# Patient Record
Sex: Female | Born: 1980 | Race: White | Hispanic: Yes | Marital: Married | Smoking: Never smoker
Health system: Southern US, Community
[De-identification: ages and names within clinical notes are randomized; demographics above are authoritative.]

## PROBLEM LIST (undated history)

## (undated) DIAGNOSIS — K219 Gastro-esophageal reflux disease without esophagitis: Secondary | ICD-10-CM

## (undated) DIAGNOSIS — K8050 Calculus of bile duct without cholangitis or cholecystitis without obstruction: Secondary | ICD-10-CM

## (undated) DIAGNOSIS — N912 Amenorrhea, unspecified: Secondary | ICD-10-CM

## (undated) DIAGNOSIS — K8020 Calculus of gallbladder without cholecystitis without obstruction: Secondary | ICD-10-CM

## (undated) DIAGNOSIS — D649 Anemia, unspecified: Secondary | ICD-10-CM

## (undated) HISTORY — DX: Calculus of bile duct without cholangitis or cholecystitis without obstruction: K80.50

## (undated) HISTORY — DX: Amenorrhea, unspecified: N91.2

## (undated) HISTORY — PX: OVARIAN CYST REMOVAL: SHX89

## (undated) HISTORY — DX: Calculus of gallbladder without cholecystitis without obstruction: K80.20

---

## 2002-12-18 ENCOUNTER — Inpatient Hospital Stay (HOSPITAL_COMMUNITY): Admission: AD | Admit: 2002-12-18 | Discharge: 2002-12-18 | Payer: Self-pay | Admitting: *Deleted

## 2002-12-19 ENCOUNTER — Inpatient Hospital Stay (HOSPITAL_COMMUNITY): Admission: AD | Admit: 2002-12-19 | Discharge: 2002-12-21 | Payer: Self-pay | Admitting: *Deleted

## 2005-09-25 ENCOUNTER — Ambulatory Visit: Payer: Self-pay | Admitting: Obstetrics and Gynecology

## 2005-10-01 ENCOUNTER — Ambulatory Visit (HOSPITAL_COMMUNITY): Admission: RE | Admit: 2005-10-01 | Discharge: 2005-10-01 | Payer: Self-pay | Admitting: *Deleted

## 2005-10-16 ENCOUNTER — Ambulatory Visit: Payer: Self-pay | Admitting: Family Medicine

## 2005-11-06 ENCOUNTER — Ambulatory Visit: Payer: Self-pay | Admitting: Family Medicine

## 2005-12-04 ENCOUNTER — Ambulatory Visit: Payer: Self-pay | Admitting: *Deleted

## 2006-01-29 ENCOUNTER — Ambulatory Visit: Payer: Self-pay | Admitting: Family Medicine

## 2006-05-07 ENCOUNTER — Ambulatory Visit: Payer: Self-pay | Admitting: Family Medicine

## 2006-05-07 ENCOUNTER — Encounter: Payer: Self-pay | Admitting: Family Medicine

## 2006-06-25 ENCOUNTER — Ambulatory Visit: Payer: Self-pay | Admitting: Obstetrics and Gynecology

## 2006-08-19 ENCOUNTER — Ambulatory Visit: Payer: Self-pay | Admitting: Obstetrics & Gynecology

## 2006-09-24 ENCOUNTER — Ambulatory Visit: Payer: Self-pay | Admitting: Obstetrics & Gynecology

## 2007-05-27 ENCOUNTER — Ambulatory Visit: Payer: Self-pay | Admitting: Obstetrics & Gynecology

## 2007-07-05 ENCOUNTER — Inpatient Hospital Stay (HOSPITAL_COMMUNITY): Admission: AD | Admit: 2007-07-05 | Discharge: 2007-07-05 | Payer: Self-pay | Admitting: Obstetrics and Gynecology

## 2007-07-28 ENCOUNTER — Ambulatory Visit (HOSPITAL_COMMUNITY): Admission: RE | Admit: 2007-07-28 | Discharge: 2007-07-28 | Payer: Self-pay | Admitting: Gynecology

## 2007-08-25 ENCOUNTER — Ambulatory Visit: Payer: Self-pay | Admitting: Obstetrics and Gynecology

## 2007-09-28 ENCOUNTER — Encounter (INDEPENDENT_AMBULATORY_CARE_PROVIDER_SITE_OTHER): Payer: Self-pay | Admitting: Internal Medicine

## 2007-09-28 ENCOUNTER — Ambulatory Visit (HOSPITAL_COMMUNITY): Admission: RE | Admit: 2007-09-28 | Discharge: 2007-09-28 | Payer: Self-pay | Admitting: Obstetrics and Gynecology

## 2007-09-28 ENCOUNTER — Ambulatory Visit: Payer: Self-pay | Admitting: Obstetrics and Gynecology

## 2007-09-28 ENCOUNTER — Encounter: Payer: Self-pay | Admitting: Obstetrics and Gynecology

## 2007-10-14 ENCOUNTER — Ambulatory Visit: Payer: Self-pay | Admitting: Gynecology

## 2008-01-12 ENCOUNTER — Ambulatory Visit: Payer: Self-pay | Admitting: Obstetrics and Gynecology

## 2008-04-12 ENCOUNTER — Ambulatory Visit: Payer: Self-pay | Admitting: Gynecology

## 2008-09-19 ENCOUNTER — Encounter (INDEPENDENT_AMBULATORY_CARE_PROVIDER_SITE_OTHER): Payer: Self-pay | Admitting: Internal Medicine

## 2008-09-19 ENCOUNTER — Ambulatory Visit: Payer: Self-pay | Admitting: Internal Medicine

## 2008-09-19 DIAGNOSIS — N920 Excessive and frequent menstruation with regular cycle: Secondary | ICD-10-CM | POA: Insufficient documentation

## 2008-09-20 ENCOUNTER — Encounter (INDEPENDENT_AMBULATORY_CARE_PROVIDER_SITE_OTHER): Payer: Self-pay | Admitting: Internal Medicine

## 2008-09-20 LAB — CONVERTED CEMR LAB
Chlamydia, DNA Probe: NEGATIVE
DHEA-SO4: 167 ug/dL (ref 35–430)
GC Probe Amp, Genital: NEGATIVE
Insulin: 20 microintl units/mL (ref 3–28)
Prolactin: 8.6 ng/mL
Sex Hormone Binding: 21 nmol/L (ref 18–114)
TSH: 1.755 microintl units/mL (ref 0.350–4.50)
Testosterone Free: 14.3 pg/mL — ABNORMAL HIGH (ref 0.6–6.8)
Testosterone-% Free: 2.3 % (ref 0.4–2.4)
Testosterone: 61.98 ng/dL (ref 10–70)

## 2008-09-21 ENCOUNTER — Encounter (INDEPENDENT_AMBULATORY_CARE_PROVIDER_SITE_OTHER): Payer: Self-pay | Admitting: Internal Medicine

## 2008-09-21 ENCOUNTER — Ambulatory Visit: Payer: Self-pay | Admitting: *Deleted

## 2008-09-21 LAB — CONVERTED CEMR LAB: DHEA-SO4: 179 ug/dL (ref 35–430)

## 2009-04-04 ENCOUNTER — Ambulatory Visit: Payer: Self-pay | Admitting: Internal Medicine

## 2009-04-04 ENCOUNTER — Encounter (INDEPENDENT_AMBULATORY_CARE_PROVIDER_SITE_OTHER): Payer: Self-pay | Admitting: Internal Medicine

## 2009-04-04 DIAGNOSIS — N912 Amenorrhea, unspecified: Secondary | ICD-10-CM | POA: Insufficient documentation

## 2009-04-04 HISTORY — DX: Amenorrhea, unspecified: N91.2

## 2009-04-04 LAB — CONVERTED CEMR LAB
Bilirubin Urine: NEGATIVE
Blood in Urine, dipstick: NEGATIVE
Glucose, Urine, Semiquant: NEGATIVE
Ketones, urine, test strip: NEGATIVE
Nitrite: NEGATIVE
Specific Gravity, Urine: 1.02
Urobilinogen, UA: 0.2
pH: 6

## 2009-04-16 LAB — CONVERTED CEMR LAB
ALT: 14 units/L (ref 0–35)
AST: 17 units/L (ref 0–37)
Albumin: 4.2 g/dL (ref 3.5–5.2)
Alkaline Phosphatase: 68 units/L (ref 39–117)
BUN: 9 mg/dL (ref 6–23)
Basophils Absolute: 0 10*3/uL (ref 0.0–0.1)
Basophils Relative: 1 % (ref 0–1)
CO2: 20 meq/L (ref 19–32)
Calcium: 8.8 mg/dL (ref 8.4–10.5)
Chlamydia, DNA Probe: NEGATIVE
Chloride: 107 meq/L (ref 96–112)
Cholesterol: 155 mg/dL (ref 0–200)
Creatinine, Ser: 0.53 mg/dL (ref 0.40–1.20)
Eosinophils Absolute: 0.3 10*3/uL (ref 0.0–0.7)
Eosinophils Relative: 4 % (ref 0–5)
GC Probe Amp, Genital: NEGATIVE
Glucose, Bld: 94 mg/dL (ref 70–99)
HCT: 37.1 % (ref 36.0–46.0)
HDL: 37 mg/dL — ABNORMAL LOW (ref >39)
Hemoglobin: 10.9 g/dL — ABNORMAL LOW (ref 12.0–15.0)
LDL Cholesterol: 94 mg/dL (ref 0–99)
Lymphocytes Relative: 24 % (ref 12–46)
Lymphs Abs: 2 10*3/uL (ref 0.7–4.0)
MCHC: 29.4 g/dL — ABNORMAL LOW (ref 30.0–36.0)
MCV: 65 fL — ABNORMAL LOW (ref 78.0–100.0)
Monocytes Absolute: 0.4 10*3/uL (ref 0.1–1.0)
Monocytes Relative: 5 % (ref 3–12)
Neutro Abs: 5.6 10*3/uL (ref 1.7–7.7)
Neutrophils Relative %: 66 % (ref 43–77)
Platelets: 429 10*3/uL — ABNORMAL HIGH (ref 150–400)
Potassium: 4 meq/L (ref 3.5–5.3)
RBC: 5.71 M/uL — ABNORMAL HIGH (ref 3.87–5.11)
RDW: 21 % — ABNORMAL HIGH (ref 11.5–15.5)
Sodium: 138 meq/L (ref 135–145)
Total Bilirubin: 0.3 mg/dL (ref 0.3–1.2)
Total CHOL/HDL Ratio: 4.2
Total Protein: 7.2 g/dL (ref 6.0–8.3)
Triglycerides: 118 mg/dL (ref <150)
VLDL: 24 mg/dL (ref 0–40)
WBC: 8.5 10*3/uL (ref 4.0–10.5)

## 2009-05-14 ENCOUNTER — Encounter (INDEPENDENT_AMBULATORY_CARE_PROVIDER_SITE_OTHER): Payer: Self-pay | Admitting: Internal Medicine

## 2009-06-22 ENCOUNTER — Ambulatory Visit: Payer: Self-pay | Admitting: Internal Medicine

## 2009-07-04 LAB — CONVERTED CEMR LAB: HCT: 50 %

## 2009-07-26 ENCOUNTER — Inpatient Hospital Stay (HOSPITAL_COMMUNITY): Admission: AD | Admit: 2009-07-26 | Discharge: 2009-07-26 | Payer: Self-pay | Admitting: Obstetrics & Gynecology

## 2009-09-10 ENCOUNTER — Inpatient Hospital Stay (HOSPITAL_COMMUNITY): Admission: AD | Admit: 2009-09-10 | Discharge: 2009-09-10 | Payer: Self-pay | Admitting: Obstetrics & Gynecology

## 2009-09-16 ENCOUNTER — Inpatient Hospital Stay (HOSPITAL_COMMUNITY): Admission: AD | Admit: 2009-09-16 | Discharge: 2009-09-16 | Payer: Self-pay | Admitting: Obstetrics & Gynecology

## 2009-10-25 ENCOUNTER — Ambulatory Visit: Payer: Self-pay | Admitting: Family Medicine

## 2009-10-25 ENCOUNTER — Inpatient Hospital Stay (HOSPITAL_COMMUNITY): Admission: RE | Admit: 2009-10-25 | Discharge: 2009-10-25 | Payer: Self-pay | Admitting: *Deleted

## 2009-11-28 ENCOUNTER — Ambulatory Visit (HOSPITAL_COMMUNITY): Admission: RE | Admit: 2009-11-28 | Discharge: 2009-11-28 | Payer: Self-pay | Admitting: Obstetrics & Gynecology

## 2010-01-07 ENCOUNTER — Ambulatory Visit: Payer: Self-pay | Admitting: Obstetrics & Gynecology

## 2010-01-07 ENCOUNTER — Encounter: Admission: RE | Admit: 2010-01-07 | Discharge: 2010-02-18 | Payer: Self-pay | Admitting: Obstetrics & Gynecology

## 2010-01-14 ENCOUNTER — Ambulatory Visit (HOSPITAL_COMMUNITY): Admission: RE | Admit: 2010-01-14 | Discharge: 2010-01-14 | Payer: Self-pay | Admitting: Family Medicine

## 2010-01-14 ENCOUNTER — Ambulatory Visit: Payer: Self-pay | Admitting: Obstetrics & Gynecology

## 2010-01-21 ENCOUNTER — Ambulatory Visit: Payer: Self-pay | Admitting: Obstetrics & Gynecology

## 2010-01-28 ENCOUNTER — Ambulatory Visit: Payer: Self-pay | Admitting: Obstetrics & Gynecology

## 2010-02-04 ENCOUNTER — Ambulatory Visit: Payer: Self-pay | Admitting: Obstetrics & Gynecology

## 2010-02-11 ENCOUNTER — Ambulatory Visit: Payer: Self-pay | Admitting: Obstetrics & Gynecology

## 2010-02-11 ENCOUNTER — Encounter: Payer: Self-pay | Admitting: Family

## 2010-02-11 LAB — CONVERTED CEMR LAB
Chlamydia, DNA Probe: NEGATIVE
GC Probe Amp, Genital: NEGATIVE

## 2010-02-18 ENCOUNTER — Encounter: Payer: Self-pay | Admitting: Advanced Practice Midwife

## 2010-02-18 ENCOUNTER — Ambulatory Visit: Payer: Self-pay | Admitting: Obstetrics and Gynecology

## 2010-02-23 ENCOUNTER — Inpatient Hospital Stay (HOSPITAL_COMMUNITY): Admission: AD | Admit: 2010-02-23 | Discharge: 2010-02-27 | Payer: Self-pay | Admitting: Family Medicine

## 2010-02-25 ENCOUNTER — Ambulatory Visit: Payer: Self-pay | Admitting: Family Medicine

## 2010-05-03 ENCOUNTER — Emergency Department (HOSPITAL_COMMUNITY): Admission: EM | Admit: 2010-05-03 | Discharge: 2010-05-04 | Payer: Self-pay | Admitting: Emergency Medicine

## 2011-01-19 ENCOUNTER — Encounter: Payer: Self-pay | Admitting: *Deleted

## 2011-01-19 ENCOUNTER — Encounter: Payer: Self-pay | Admitting: Obstetrics & Gynecology

## 2011-03-16 LAB — POCT URINALYSIS DIP (DEVICE)
Bilirubin Urine: NEGATIVE
Bilirubin Urine: NEGATIVE
Bilirubin Urine: NEGATIVE
Bilirubin Urine: NEGATIVE
Glucose, UA: NEGATIVE mg/dL
Glucose, UA: NEGATIVE mg/dL
Glucose, UA: NEGATIVE mg/dL
Glucose, UA: NEGATIVE mg/dL
Hgb urine dipstick: NEGATIVE
Hgb urine dipstick: NEGATIVE
Hgb urine dipstick: NEGATIVE
Hgb urine dipstick: NEGATIVE
Ketones, ur: NEGATIVE mg/dL
Ketones, ur: NEGATIVE mg/dL
Ketones, ur: NEGATIVE mg/dL
Ketones, ur: NEGATIVE mg/dL
Nitrite: NEGATIVE
Nitrite: NEGATIVE
Nitrite: NEGATIVE
Nitrite: NEGATIVE
Protein, ur: NEGATIVE mg/dL
Protein, ur: 30 mg/dL — AB
Protein, ur: NEGATIVE mg/dL
Protein, ur: NEGATIVE mg/dL
Specific Gravity, Urine: 1.015 (ref 1.005–1.030)
Specific Gravity, Urine: 1.01 (ref 1.005–1.030)
Specific Gravity, Urine: 1.015 (ref 1.005–1.030)
Specific Gravity, Urine: 1.02 (ref 1.005–1.030)
Urobilinogen, UA: 1 mg/dL (ref 0.0–1.0)
Urobilinogen, UA: 0.2 mg/dL (ref 0.0–1.0)
Urobilinogen, UA: 0.2 mg/dL (ref 0.0–1.0)
Urobilinogen, UA: 0.2 mg/dL (ref 0.0–1.0)
pH: 7 (ref 5.0–8.0)
pH: 6 (ref 5.0–8.0)
pH: 6.5 (ref 5.0–8.0)
pH: 6.5 (ref 5.0–8.0)

## 2011-03-18 LAB — URINALYSIS, ROUTINE W REFLEX MICROSCOPIC
Bilirubin Urine: NEGATIVE
Glucose, UA: NEGATIVE mg/dL
Ketones, ur: NEGATIVE mg/dL
Nitrite: NEGATIVE
Protein, ur: NEGATIVE mg/dL
Specific Gravity, Urine: 1.008 (ref 1.005–1.030)
Urobilinogen, UA: 1 mg/dL (ref 0.0–1.0)
pH: 6.5 (ref 5.0–8.0)

## 2011-03-18 LAB — CBC
HCT: 39.3 % (ref 36.0–46.0)
Hemoglobin: 13.1 g/dL (ref 12.0–15.0)
MCHC: 33.5 g/dL (ref 30.0–36.0)
MCV: 82.3 fL (ref 78.0–100.0)
Platelets: 302 10*3/uL (ref 150–400)
RBC: 4.77 MIL/uL (ref 3.87–5.11)
RDW: 15.1 % (ref 11.5–15.5)
WBC: 9.9 10*3/uL (ref 4.0–10.5)

## 2011-03-18 LAB — DIFFERENTIAL
Basophils Absolute: 0.1 10*3/uL (ref 0.0–0.1)
Basophils Relative: 1 % (ref 0–1)
Eosinophils Absolute: 0.3 10*3/uL (ref 0.0–0.7)
Eosinophils Relative: 3 % (ref 0–5)
Lymphocytes Relative: 16 % (ref 12–46)
Lymphs Abs: 1.6 10*3/uL (ref 0.7–4.0)
Monocytes Absolute: 0.5 10*3/uL (ref 0.1–1.0)
Monocytes Relative: 5 % (ref 3–12)
Neutro Abs: 7.4 10*3/uL (ref 1.7–7.7)
Neutrophils Relative %: 75 % (ref 43–77)

## 2011-03-18 LAB — COMPREHENSIVE METABOLIC PANEL
ALT: 229 U/L — ABNORMAL HIGH (ref 0–35)
AST: 254 U/L — ABNORMAL HIGH (ref 0–37)
Albumin: 3.6 g/dL (ref 3.5–5.2)
Alkaline Phosphatase: 155 U/L — ABNORMAL HIGH (ref 39–117)
BUN: 9 mg/dL (ref 6–23)
CO2: 22 mEq/L (ref 19–32)
Calcium: 8.8 mg/dL (ref 8.4–10.5)
Chloride: 106 mEq/L (ref 96–112)
Creatinine, Ser: 0.59 mg/dL (ref 0.4–1.2)
GFR calc Af Amer: >60 mL/min (ref >60)
GFR calc non Af Amer: >60 mL/min (ref >60)
Glucose, Bld: 124 mg/dL — ABNORMAL HIGH (ref 70–99)
Potassium: 3.6 mEq/L (ref 3.5–5.1)
Sodium: 134 mEq/L — ABNORMAL LOW (ref 135–145)
Total Bilirubin: 0.7 mg/dL (ref 0.3–1.2)
Total Protein: 7.8 g/dL (ref 6.0–8.3)

## 2011-03-18 LAB — URINE MICROSCOPIC-ADD ON

## 2011-03-18 LAB — POCT PREGNANCY, URINE: Preg Test, Ur: NEGATIVE

## 2011-03-18 LAB — LIPASE, BLOOD: Lipase: 35 U/L (ref 11–59)

## 2011-03-19 LAB — POCT URINALYSIS DIP (DEVICE)
Bilirubin Urine: NEGATIVE
Hgb urine dipstick: NEGATIVE
Hgb urine dipstick: NEGATIVE
Ketones, ur: NEGATIVE mg/dL
Nitrite: NEGATIVE
Nitrite: POSITIVE — AB
Protein, ur: 100 mg/dL — AB
Protein, ur: 30 mg/dL — AB
Protein, ur: NEGATIVE mg/dL
Specific Gravity, Urine: 1.02 (ref 1.005–1.030)
Urobilinogen, UA: 0.2 mg/dL (ref 0.0–1.0)
Urobilinogen, UA: 0.2 mg/dL (ref 0.0–1.0)
pH: 6.5 (ref 5.0–8.0)
pH: 6.5 (ref 5.0–8.0)
pH: 6.5 (ref 5.0–8.0)

## 2011-03-19 LAB — PROTEIN, URINE, 24 HOUR
Protein, 24H Urine: 113 mg/d — ABNORMAL HIGH (ref 50–100)
Protein, Urine: 3 mg/dL
Urine Total Volume-UPROT: 3775 mL

## 2011-03-19 LAB — COMPREHENSIVE METABOLIC PANEL
ALT: 101 U/L — ABNORMAL HIGH (ref 0–35)
ALT: 96 U/L — ABNORMAL HIGH (ref 0–35)
AST: 72 U/L — ABNORMAL HIGH (ref 0–37)
AST: 81 U/L — ABNORMAL HIGH (ref 0–37)
Albumin: 2.3 g/dL — ABNORMAL LOW (ref 3.5–5.2)
Albumin: 2.3 g/dL — ABNORMAL LOW (ref 3.5–5.2)
Albumin: 2.4 g/dL — ABNORMAL LOW (ref 3.5–5.2)
Albumin: 2.4 g/dL — ABNORMAL LOW (ref 3.5–5.2)
Alkaline Phosphatase: 267 U/L — ABNORMAL HIGH (ref 39–117)
Alkaline Phosphatase: 270 U/L — ABNORMAL HIGH (ref 39–117)
BUN: 3 mg/dL — ABNORMAL LOW (ref 6–23)
BUN: 4 mg/dL — ABNORMAL LOW (ref 6–23)
BUN: 7 mg/dL (ref 6–23)
CO2: 21 mEq/L (ref 19–32)
Calcium: 7.7 mg/dL — ABNORMAL LOW (ref 8.4–10.5)
Calcium: 8.3 mg/dL — ABNORMAL LOW (ref 8.4–10.5)
Chloride: 104 mEq/L (ref 96–112)
Chloride: 105 mEq/L (ref 96–112)
Chloride: 109 mEq/L (ref 96–112)
Creatinine, Ser: 0.44 mg/dL (ref 0.4–1.2)
Creatinine, Ser: 0.47 mg/dL (ref 0.4–1.2)
GFR calc Af Amer: >60 mL/min (ref >60)
GFR calc Af Amer: >60 mL/min (ref >60)
GFR calc non Af Amer: >60 mL/min (ref >60)
Glucose, Bld: 105 mg/dL — ABNORMAL HIGH (ref 70–99)
Potassium: 3.1 mEq/L — ABNORMAL LOW (ref 3.5–5.1)
Potassium: 3.6 mEq/L (ref 3.5–5.1)
Potassium: 3.6 mEq/L (ref 3.5–5.1)
Sodium: 133 mEq/L — ABNORMAL LOW (ref 135–145)
Sodium: 133 mEq/L — ABNORMAL LOW (ref 135–145)
Sodium: 134 mEq/L — ABNORMAL LOW (ref 135–145)
Total Bilirubin: 0.8 mg/dL (ref 0.3–1.2)
Total Bilirubin: 1.1 mg/dL (ref 0.3–1.2)
Total Bilirubin: 1.3 mg/dL — ABNORMAL HIGH (ref 0.3–1.2)
Total Protein: 5.9 g/dL — ABNORMAL LOW (ref 6.0–8.3)
Total Protein: 6 g/dL (ref 6.0–8.3)
Total Protein: 6.2 g/dL (ref 6.0–8.3)

## 2011-03-19 LAB — HEPATIC FUNCTION PANEL
ALT: 84 U/L — ABNORMAL HIGH (ref 0–35)
Alkaline Phosphatase: 240 U/L — ABNORMAL HIGH (ref 39–117)
Indirect Bilirubin: 0.2 mg/dL — ABNORMAL LOW (ref 0.3–0.9)
Total Protein: 5.8 g/dL — ABNORMAL LOW (ref 6.0–8.3)

## 2011-03-19 LAB — URINALYSIS, ROUTINE W REFLEX MICROSCOPIC
Bilirubin Urine: NEGATIVE
Glucose, UA: NEGATIVE mg/dL
Ketones, ur: NEGATIVE mg/dL
Nitrite: NEGATIVE
Protein, ur: NEGATIVE mg/dL
pH: 6 (ref 5.0–8.0)

## 2011-03-19 LAB — GLUCOSE, CAPILLARY
Glucose-Capillary: 64 mg/dL — ABNORMAL LOW (ref 70–99)
Glucose-Capillary: 67 mg/dL — ABNORMAL LOW (ref 70–99)
Glucose-Capillary: 70 mg/dL (ref 70–99)
Glucose-Capillary: 77 mg/dL (ref 70–99)
Glucose-Capillary: 88 mg/dL (ref 70–99)

## 2011-03-19 LAB — HEPATITIS B CORE ANTIBODY, TOTAL: Hep B Core Total Ab: NEGATIVE

## 2011-03-19 LAB — HEPATITIS B SURFACE ANTIBODY,QUALITATIVE: Hep B S Ab: POSITIVE — AB

## 2011-03-19 LAB — CBC
HCT: 42.7 % (ref 36.0–46.0)
HCT: 43.4 % (ref 36.0–46.0)
Hemoglobin: 14.3 g/dL (ref 12.0–15.0)
MCHC: 32.9 g/dL (ref 30.0–36.0)
Platelets: 190 10*3/uL (ref 150–400)
Platelets: 212 10*3/uL (ref 150–400)
Platelets: 241 10*3/uL (ref 150–400)
RDW: 16 % — ABNORMAL HIGH (ref 11.5–15.5)
RDW: 16.1 % — ABNORMAL HIGH (ref 11.5–15.5)
RDW: 16.4 % — ABNORMAL HIGH (ref 11.5–15.5)
WBC: 7.3 10*3/uL (ref 4.0–10.5)
WBC: 8.3 10*3/uL (ref 4.0–10.5)

## 2011-03-19 LAB — AMYLASE: Amylase: 112 U/L — ABNORMAL HIGH (ref 0–105)

## 2011-03-19 LAB — URIC ACID
Uric Acid, Serum: 3.3 mg/dL (ref 2.4–7.0)
Uric Acid, Serum: 3.5 mg/dL (ref 2.4–7.0)

## 2011-03-19 LAB — HEPATITIS A ANTIBODY, TOTAL: Hep A Total Ab: POSITIVE — AB

## 2011-03-19 LAB — LACTATE DEHYDROGENASE: LDH: 140 U/L (ref 94–250)

## 2011-03-19 LAB — LIPASE, BLOOD: Lipase: 42 U/L (ref 11–59)

## 2011-03-19 LAB — RPR: RPR Ser Ql: NONREACTIVE

## 2011-03-19 LAB — CREATININE CLEARANCE, URINE, 24 HOUR
Creatinine Clearance: 200 mL/min — ABNORMAL HIGH (ref 75–115)
Creatinine: 0.47 mg/dL (ref 0.4–1.2)

## 2011-03-23 LAB — COMPREHENSIVE METABOLIC PANEL
AST: 31 U/L (ref 0–37)
Albumin: 1.9 g/dL — ABNORMAL LOW (ref 3.5–5.2)
BUN: 5 mg/dL — ABNORMAL LOW (ref 6–23)
Calcium: 7 mg/dL — ABNORMAL LOW (ref 8.4–10.5)
Chloride: 108 mEq/L (ref 96–112)
Creatinine, Ser: 0.46 mg/dL (ref 0.4–1.2)
GFR calc Af Amer: >60 mL/min (ref >60)
Total Bilirubin: 0.1 mg/dL — ABNORMAL LOW (ref 0.3–1.2)

## 2011-03-23 LAB — CBC
HCT: 36.5 % (ref 36.0–46.0)
MCHC: 33 g/dL (ref 30.0–36.0)
MCV: 83.1 fL (ref 78.0–100.0)
Platelets: 245 10*3/uL (ref 150–400)
RDW: 16 % — ABNORMAL HIGH (ref 11.5–15.5)

## 2011-04-03 LAB — URINE CULTURE: Colony Count: >=100000

## 2011-04-03 LAB — URINALYSIS, ROUTINE W REFLEX MICROSCOPIC
Glucose, UA: NEGATIVE mg/dL
Protein, ur: NEGATIVE mg/dL
Specific Gravity, Urine: 1.015 (ref 1.005–1.030)
Urobilinogen, UA: 0.2 mg/dL (ref 0.0–1.0)

## 2011-04-03 LAB — URINE MICROSCOPIC-ADD ON

## 2011-04-04 LAB — WET PREP, GENITAL
Clue Cells Wet Prep HPF POC: NONE SEEN
Trich, Wet Prep: NONE SEEN
Yeast Wet Prep HPF POC: NONE SEEN

## 2011-04-04 LAB — ABO/RH: ABO/RH(D): O POS

## 2011-04-04 LAB — GC/CHLAMYDIA PROBE AMP, GENITAL: GC Probe Amp, Genital: NEGATIVE

## 2011-04-04 LAB — CBC
Hemoglobin: 13.4 g/dL (ref 12.0–15.0)
RBC: 4.92 MIL/uL (ref 3.87–5.11)
WBC: 10.9 10*3/uL — ABNORMAL HIGH (ref 4.0–10.5)

## 2011-04-06 LAB — URINALYSIS, ROUTINE W REFLEX MICROSCOPIC
Bilirubin Urine: NEGATIVE
Glucose, UA: NEGATIVE mg/dL
Hgb urine dipstick: NEGATIVE
Ketones, ur: NEGATIVE mg/dL
Protein, ur: NEGATIVE mg/dL
Urobilinogen, UA: 0.2 mg/dL (ref 0.0–1.0)

## 2011-04-06 LAB — POCT PREGNANCY, URINE: Preg Test, Ur: POSITIVE

## 2011-05-13 NOTE — Group Therapy Note (Signed)
NAMEJAKYIAH, BRIONES              ACCOUNT NO.:  [id number]   MEDICAL RECORD NO.:  [id number]          PATIENT TYPE:  WOC   LOCATION:  [facility]                   FACILITY:  [facility]   PHYSICIAN:  Argentina Donovan, MD        DATE OF BIRTH:  30-May-1981   DATE OF SERVICE:  01/12/2008                                  CLINIC NOTE   The the patient is a 26-year Spanish speaking Hispanic female gravida 1,  para 1-0-0-1, with a child age 29 years old, who has been trying get  pregnant for some time. She has had a history of amenorrhea, long  history of amenorrhea for which Dr. Penne Lash put her on Glucophage. She  began having regular periods and then stopped the Glucophage and started  having constant bleeding.  Underwent hysteroscopy D&C with a normal  outcome 2 months ago and has had bleeding on and off since that time.  We have talked to her with her intentions where she is not trying get  pregnant.  We are going to put her on Glucophage.  I told her 3 months  and if she needs to go on Clomid or any such drugs, she is falling into  a different category in fertility. We are going to start her 500 b.i.d.  and have her returned in 3 months if the periods have not regulated by  that time.           ______________________________  Argentina Donovan, MD     PR/MEDQ  D:  01/12/2008  T:  01/12/2008  Job:  [id number]

## 2011-05-13 NOTE — Group Therapy Note (Signed)
NAMEBELEN, PESCH NO.:  [id number]   MEDICAL RECORD NO.:  [id number]          PATIENT TYPE:  WOC   LOCATION:  [facility]                   FACILITY:  [facility]   PHYSICIAN:  Argentina Donovan, MD        DATE OF BIRTH:  [birth year]   DATE OF SERVICE:                                  CLINIC NOTE   The patient is a 30 year old Spanish-speaking Hispanic female gravida 1,  para 1-0-0-1 with a child age four years old and the same partner.  Has  been unable to get pregnant since trying for 1 year, was early in the  year placed on Glucophage and Clomid by Dr. Penne Lash.  She is not taking  it now but has had spotting continually for almost 5 months she says.  Her hemoglobin does not reflect a significant amount of bleeding as is  normal.  She had an ultrasound which was of limited evaluation because  of the heavy vaginal bleeding at that time it was done but the  endometrial thickness there appeared to be about 1 cm and there were  questions of whether she had an endometrial polyp with her dysfunctional  bleeding and is patient who weighs 190 pounds at 5 feet three inches ,  polycystic ovarian syndrome certainly has to be considered.  However,  the ovaries were normal on sonogram. The patient does show some signs of  hirsutism but very mild. I think that, however, we talked about clearing  up her bleeding and we will schedule it for hysteroscopy, D&C and go on  from there after we can check her most annoying problem out of way.   IMPRESSION:  Dysfunctional uterine bleeding, probable uterine polyp.           ______________________________  Argentina Donovan, MD     PR/MEDQ  D:  08/25/2007  T:  08/26/2007  Job:  [id number]

## 2011-05-13 NOTE — Op Note (Signed)
NAMEKERSTYN, [name]              ACCOUNT NO.:  [id number]   MEDICAL RECORD NO.:  [id number]          PATIENT TYPE:  AMB   LOCATION:                                [facility]   PHYSICIAN:  Phil D. Okey Dupre, M.D.     DATE OF BIRTH:  [birth year]   DATE OF PROCEDURE:  09/28/2007  DATE OF DISCHARGE:                               OPERATIVE REPORT   PROCEDURE:  1. Hysteroscopic examination.  2. Dilatation curettage.   PREOPERATIVE DIAGNOSIS:  1. Five months of continual vaginal spotting.  2. Probable endometrial polyp.   POSTOPERATIVE DIAGNOSIS:  Pending pathology report.   ANESTHESIA:  General.   ESTIMATED BLOOD LOSS:  Minimal.   POSTOPERATIVE CONDITION:  Satisfactory.   SURGEON:  Javier Glazier. Okey Dupre, M.D.   PATHOLOGY SPECIMENS:  Endometrial curettings.   PROCEDURE WENT AS FOLLOWS:  Under satisfactory general anesthesia with  the patient in dorsal lithotomy position, perineum and vagina prepped  and draped in the usual sterile manner.  Bimanual pelvic examination  under anesthesia revealed the uterus of normal size, shape, and  consistency, anteflexed, freely movable with normal free adnexa.  Weighted speculum was placed in the posterior portion of the vagina and  the cervix grasped with single-tooth tenaculum.   Uterine cavity sounded to a depth of 8 cm.  The cervical os was dilated  to #6 Hegar dilator.  The hysteroscope was inserted into the uterine  cavity to the fundus using normal saline as a dilating medium.  The  uterine cavity was easily visualized and found to be completely normal.  No polyps were noted.  The uterine cavity was vigorously curetted and a  large amount of endometrial tissue obtained, and sent for pathological  diagnosis.  Tenaculum and speculum removed from the vagina.  The patient  transferred to the recovery room in satisfactory condition.      Phil D. Okey Dupre, M.D.  Electronically Signed    PDR/MEDQ  D:  09/28/2007  T:  09/28/2007  Job:  [id number]

## 2011-05-16 NOTE — Group Therapy Note (Signed)
[name], [name]              ACCOUNT NO.:  [id number]   MEDICAL RECORD NO.:  [id number]          PATIENT TYPE:  WOC   LOCATION:  [facility]                   FACILITY:  [facility]   PHYSICIAN:  Argentina Donovan, MD        DATE OF BIRTH:  03-30-1981   DATE OF SERVICE:  09/25/2005                                    CLINIC NOTE   This is a 30 year old gravida 1, para 1-0-0-1 with a child by normal vaginal  delivery three years ago who has been followed with regular Pap smears by  the health department.  Immediately after her delivery she went on Depo  Provera, but bled every day so after three months they switched her to the  oral contraceptives.  She took oral contraceptives up until early in January  of this year and was completely amenorrheic from that time on.  In June she  received a progesterone challenge and had a bleeding episode but other than  that she has been amenorrheic since February of 2006.  She has no sign of  acne or abnormal hair growth.  She thinks she has gained 10 pounds since the  baby was born and weighs 181 pounds.  The patient had a normal Pap smear,  normal pelvic in March at the health department and was referred here  because of her amenorrhea.  Her desire also is to get pregnant, therefore,  she does not want to go on oral contraceptives.  We are going to get a  laboratory evaluation for LH, FSH, TSH, testosterone, a free testosterone,  as well as a pelvic ultrasound.  She is going to come back in two weeks and  we will talk to her about treatment.  I feel that she is probably a good  candidate for Clomifene citrate and that is probably what we will start on.   IMPRESSION:  Secondary amenorrhea.           ______________________________  Argentina Donovan, MD     PR/MEDQ  D:  09/25/2005  T:  09/26/2005  Job:  [id number]

## 2011-05-16 NOTE — Group Therapy Note (Signed)
[name], ZANETTI              ACCOUNT NO.:  [id number]   MEDICAL RECORD NO.:  [id number]          PATIENT TYPE:  WOC   LOCATION:  [facility]                   FACILITY:  [facility]   PHYSICIAN:  Elsie Lincoln, MD      DATE OF BIRTH:  Jan 06, 1981   DATE OF SERVICE:  08/19/2006                                    CLINIC NOTE   The patient is a 30 year old female who presents follow up infertility.  The  patient has been on Glucophage t.i.d. but stopped approximately 3 weeks ago.  She has also failed Clomid at least once and has been up to 150 mg.  The  patient still desires to become pregnant.  She is going to do a urinary  pregnancy test today.  Also, the patient's husband has never done a semen  analysis, so I will order that.  In the meantime, we will give the patient a  withdrawal bleed after a negative pregnancy test with Provera for 10 days,  and then she is supposed to start the metformin again three times a day and  then Clomid 150 mg days 5-9 of her cycle.  She is supposed to come back in 2  months for followup and consider an hysterosalpingogram at that point, but  that is also a large expense for the patient, but this has also never been  done.           ______________________________  Elsie Lincoln, MD     KL/MEDQ  D:  08/19/2006  T:  08/20/2006  Job:  [id number]

## 2011-05-16 NOTE — Group Therapy Note (Signed)
[name], [name]              ACCOUNT NO.:  [id number]   MEDICAL RECORD NO.:  [id number]          PATIENT TYPE:  WOC   LOCATION:  [facility]                   FACILITY:  [facility]   PHYSICIAN:  Kathlyn Sacramento, M.D.   DATE OF BIRTH:  [birth year]   DATE OF SERVICE:                                    CLINIC NOTE   CHIEF COMPLAINT:  Infertility.   HISTORY OF PRESENT ILLNESS:  The patient is a 30 year old G1, P1 with a  diagnoses of PCOS and infertility, here for followup.  Her Clomid was  increased to 100 in November.  In December, she started on metformin 500  t.i.d.  The patient states that she did have a period in January, which she  took Provera to initiate it.  She brought in a basal temperature chart,  which was consistent with her ovulating on approximately day 14 of her  cycle.   PHYSICAL EXAMINATION:  VITAL SIGNS: Noted in her chart.  GENITOURINARY:  Bimanual exam showed uterus normal in size with no adnexal  mass or tenderness.   IMPRESSION:  Infertility and polycystic ovarian syndrome.   PLAN:  1.  Continue basal body temperature charting.  2.  The patient was instructed to continue to take the metformin t.i.d.  She      was instructed that if she does not have a period in four to five weeks      after her last cycle and she is not pregnant, to give herself a Provera      challenge test to initiate a cycle and to give herself Clomid on days      five through nine of her cycle.  She also was instructed to have      intercourse around the time of ovulation.  She is to follow up in three      to four months.  The plan was discussed with Dr. Shawnie Pons.           ______________________________  Kathlyn Sacramento, M.D.     AC/MEDQ  D:  01/29/2006  T:  01/29/2006  Job:  [id number]

## 2011-05-16 NOTE — Group Therapy Note (Signed)
[name], [name]              ACCOUNT NO.:  [id number]   MEDICAL RECORD NO.:  [id number]          PATIENT TYPE:  WOC   LOCATION:  [facility]                   [facility]ACILITY:  [facility]   PHYSICIAN:  Tinnie Gens, MD        DATE OF BIRTH:  [birth year]   DATE OF SERVICE:  10/16/2005                                    CLINIC NOTE   CHIEF COMPLAINT:  Follow up amenorrhea.   HISTORY OF PRESENT ILLNESS:  The patient is a 30 year old para 1 who was  previously seen here for secondary amenorrhea.  The patient underwent work-  up at that time which showed a normal TSH, an elevated free testosterone at  20, and an LH/FSH ratio of almost 3:1.  The patient had a urine pregnancy  test that was negative that day.  The patient did not have prolactin  measured at that time.   PHYSICAL EXAMINATION:  VITAL SIGNS:  As noted in the chart.  GENERAL:  She is a well-developed, well-nourished Hispanic female in no  acute distress.  ABDOMEN:  Soft, nontender, nondistended.   Her ultrasound dated October 01, 2005 showed prominent ovaries with multiple  tiny follicles suspicious for PCOS, normal-appearing uterus and endometrial  stripe.   IMPRESSION:  1.  Secondary amenorrhea probably related to PCOS.  2.  Desires pregnancy.   PLAN:  1.  Check prolactin today.  2.  If this is normal patient may return in two weeks for possible treatment      of fertility.  3.  Lengthy discussion was had with this patient regarding need for regular      cycles and increased risk of pre cancer versus cancerous lesion of the      uterus and endometrium.  Patient understood all this.  She will return      in two weeks for fertility treatment.  Would suggest Provera challenge      followed by Clomycin citrate.           ______________________________  Tinnie Gens, MD     TP/MEDQ  D:  10/16/2005  T:  10/16/2005  Job:  [id number]

## 2011-05-16 NOTE — Group Therapy Note (Signed)
[name], KUTSCH              ACCOUNT NO.:  [id number]   MEDICAL RECORD NO.:  [id number]          PATIENT TYPE:  WOC   LOCATION:  [facility]                   [facility]ACILITY:  [facility]   PHYSICIAN:  Dorthula Perfect, MD     DATE OF BIRTH:  12-25-1981   DATE OF SERVICE:  09/24/2006                                    CLINIC NOTE   A 30 year old Hispanic female who returns for followup visit regarding  infertility. See Dr. Bertram Denver note of August 22. She is taking the  metformin three times a day and is using the Clomid 150 mg a day 5 through  9. Her last menstrual period started August 26. She did use Clomid this  cycle. She feels as if she is getting ready to start her period.   Review of her husband's semen analysis reveals a count of greater than 40  million, pretty good motility, and normal forms.   PHYSICAL EXAMINATION:  ABDOMEN:  Was slightly obese, flat and nontender.  PELVIC:  External genitalia, BUS, glands normal. Vaginal wall  epithelialized. Cervix was epithelialized. She has not started her period  yet. Uterus is in the midline of normal size and shape. Adnexa structures  are normal. There are no ovarian cysts.   DISPOSITION:  1. Normal GYN exam.  2. Discussed with the patient having a hysterosalpingogram that is      mentioned in Dr. Bertram Denver note. Because of the cost factor, she is      going to discuss that with her husband. She will continue to use the      Clomid and will be rechecked in a month.           ______________________________  Dorthula Perfect, MD     ER/MEDQ  D:  09/24/2006  T:  09/26/2006  Job:  [id number]

## 2011-05-16 NOTE — Group Therapy Note (Signed)
[name], [name]              ACCOUNT NO.:  [id number]   MEDICAL RECORD NO.:  [id number]          PATIENT TYPE:  WOC   LOCATION:  [facility]                   [facility]ACILITY:  [facility]   PHYSICIAN:  Tinnie Gens, MD        DATE OF BIRTH:  02-28-1981   DATE OF SERVICE:  11/06/2005                                    CLINIC NOTE   CHIEF COMPLAINT:  Infertility.   HISTORY OF PRESENT ILLNESS:  Patient is a 30 year old para 1 who has  probable PCOS and infertility.  She comes in today for infertility follow-  up.  Her LMP is February of 2006.  She had polycystic ovaries on examination  and laboratories consistent with this diagnosis.   PHYSICAL EXAMINATION:  VITAL SIGNS:  As noted in chart.  GENERAL:  Slightly obese Hispanic female in no acute distress.  ABDOMEN:  Soft, nontender, nondistended.   IMPRESSION:  Infertility and PCOS.   PLAN:  Basal body temperature charting.  Provera 10 mg one p.o. daily x5  days.  Clomid 50 mg one p.o. daily days 5-9.  The patient will follow up in  four weeks and bring her basal body temperature chart at that time to see if  she has had an ovulation.  Pregnancy test if no cycle.  If that is negative  repeat Provera challenge and increase Clomid to 100.  Once she gets  ovulating we can follow her up every three to four months.           ______________________________  Tinnie Gens, MD     TP/MEDQ  D:  11/06/2005  T:  11/07/2005  Job:  [id number]

## 2011-05-16 NOTE — Group Therapy Note (Signed)
[name], [name]              ACCOUNT NO.:  [id number]   MEDICAL RECORD NO.:  [id number]          PATIENT TYPE:  WOC   [facility]ATION[facility]                   [facility]ACILITY[facility]   PHYSICIAN:  Tinnie Gens, MD        DATE OF BIRTH:  1981/01/24   DATE OF SERVICE:  05/07/2006                                    CLINIC NOTE   CHIEF COMPLAINT:  Follow-up infertility.   HISTORY OF PRESENT ILLNESS:  Patient is a 30 year old nulligravida who has  PCOS and secondary amenorrhea and infertility.  She has been doing a trial  of Clomid at 100 and metformin 500 mg t.i.d. with no significant change in  her symptomatology.  She still is not ovulating.  She is not having regular  cycles.  The patient's last menstrual period was in March.  She has not had  another period as she ran out of Provera.  The patient otherwise was without  complaints.   PAST MEDICAL HISTORY:  Negative.   PAST SURGICAL HISTORY:  Negative.   ALLERGIES:  NO KNOWN DRUG ALLERGIES.   MEDICATIONS:  Metformin 500 mg t.i.d.   OBSTETRIC HISTORY:  She is a G1, P1.   GYNECOLOGIC HISTORY:  Menarche at 62, very regular cycles.  Had to be on  pills previously to make them come regularly.  Last Pap was in February of  2006.   PHYSICAL EXAMINATION:  GENERAL APPEARANCE:  She is a well-developed, well-  nourished, Hispanic female in no acute distress.  VITAL SIGNS:  As on the chart.  NECK:  Supple.  Normal thyroid.  ABDOMEN:  Soft, nontender, nondistended.  GU:  Normal rectal and female genitalia.  BUS is normal.  Vagina is pink and  review of cervix is parous without lesion.  The uterus is small, anteverted.  Adnexa without mass or tenderness.   IMPRESSION:  1.  Polycystic ovarian syndrome.  2.  Infertility.  3.  Gynecologic exam with Pap smear.  4.  GPT negative today.   PLAN:  1.  Provera 10 mg to begin her cycle.  2.  Clomid 150 mg days 5-9.  3.  Basal body temperature charting.  4.  Continue Metformin daily.  5.   Patient will come in in four weeks with her basal body temperature chart      to see how things are going and if she actually did ovulate.  If she did      not, I think I am maxed out on my potential for helping her.           ______________________________  Tinnie Gens, MD     TP/MEDQ  D:  05/07/2006  T:  05/08/2006  Job:  [id number]

## 2011-05-16 NOTE — Group Therapy Note (Signed)
[name], Bright              ACCOUNT NO.:  [id number]   MEDICAL RECORD NO.:  [id number]          PATIENT TYPE:  WOC   LOCATION:  [facility]                   FACILITY:  [facility]   PHYSICIAN:  Carolanne Grumbling, M.D.   DATE OF BIRTH:  1981-09-19   DATE OF SERVICE:                                    CLINIC NOTE   CHIEF COMPLAINT:  Infertility.   HISTORY OF PRESENT ILLNESS:  A 30 year old G1, P1 with a diagnosis of PCOS  and infertility is here for followup.  Patient has had laboratories  consistent with PCOS.  She has an elevated free testosterone of 20.1 and LH  to [facility] ratio of 3:1.  Since last approximately she was given a basal body  temperature chart which she kept track of her temperatures, but did not  actually chart them out.  She was also given a Provera challenge and then  took Clomid 50 mg on day five through nine of her cycle.  She has since then  had sex for the past 10 days.  Denies any problems at this time.   PHYSICAL EXAMINATION:  VITAL SIGNS:  As noted in chart.  GENERAL:  Very pleasant Hispanic female who is slightly obese and in no  apparent distress.   LABORATORIES:  Pregnancy test negative.   IMPRESSION:  Fertility and PCOS.   PLAN:  Continue basal body temperature charting.  Will repeat the Provera  challenge of 10 mg one tablet p.o. daily x5 days.  She is then to take  Clomid 100 mg p.o. daily on days five to nine of her cycle.  Will see if she  has any insulin resistance.  Start out by getting a blood sugar on a CMP and  getting a hemoglobin A1c.  She is to start Metformin 500 mg p.o. t.i.d.  Patient was counseled on the risks and benefits of that drug.  This patient  __________ with Dr. Okey Dupre.  Follow up in one month.           ______________________________  Carolanne Grumbling, M.D.     TW/MEDQ  D:  12/04/2005  T:  12/05/2005  Job:  [id number]

## 2011-10-09 LAB — URINALYSIS, ROUTINE W REFLEX MICROSCOPIC
Bilirubin Urine: NEGATIVE
Glucose, UA: NEGATIVE
Protein, ur: NEGATIVE

## 2011-10-09 LAB — CBC
HCT: 37.3
Hemoglobin: 12.5
MCHC: 33.4
MCV: 71.7 — ABNORMAL LOW
RDW: 16.1 — ABNORMAL HIGH

## 2011-10-09 LAB — URINE MICROSCOPIC-ADD ON

## 2011-10-14 LAB — URINALYSIS, ROUTINE W REFLEX MICROSCOPIC
Bilirubin Urine: NEGATIVE
Ketones, ur: NEGATIVE
Protein, ur: NEGATIVE
Urobilinogen, UA: 0.2

## 2011-10-14 LAB — GC/CHLAMYDIA PROBE AMP, GENITAL: GC Probe Amp, Genital: NEGATIVE

## 2011-10-14 LAB — CBC
HCT: 38.7
MCV: 76.5 — ABNORMAL LOW
RBC: 5.07
WBC: 6.4

## 2011-10-14 LAB — WET PREP, GENITAL: Yeast Wet Prep HPF POC: NONE SEEN

## 2011-10-14 LAB — URINE MICROSCOPIC-ADD ON

## 2011-10-14 LAB — POCT PREGNANCY, URINE: Operator id: 220991

## 2012-02-23 IMAGING — US US OB FOLLOW-UP
1 series · 14 of 24 positions shown · non-contrast
Comparison: none

OBSTETRICAL ULTRASOUND:
 This ultrasound exam was performed in the [HOSPITAL] Ultrasound Department.  The OB US report was generated in the AS system, and faxed to the ordering physician.  This report is also available in [HOSPITAL]?s [employer] and in [REDACTED] PACS.

[Series 1: us ob comp +14 wk · 0.30mm/px · 14 of 24 slices shown]
[im 1/24]
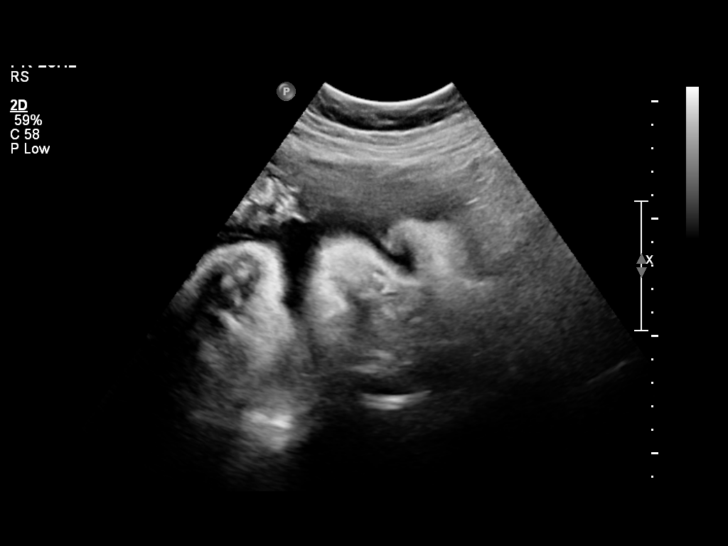
[im 3/24]
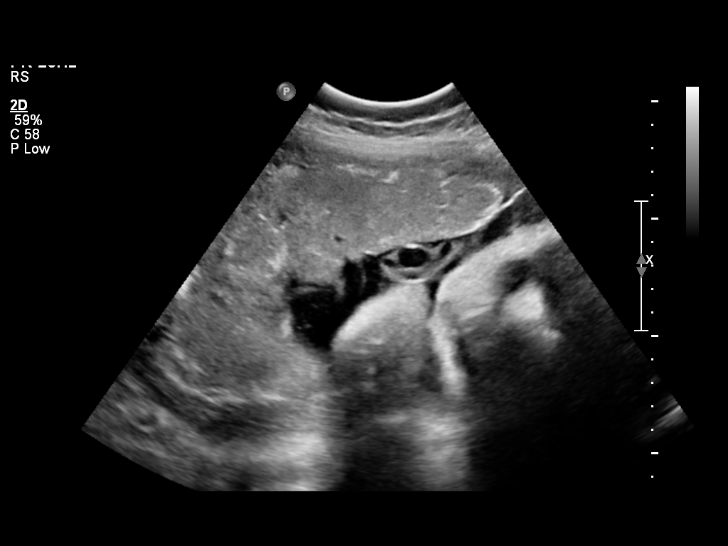
[im 5/24]
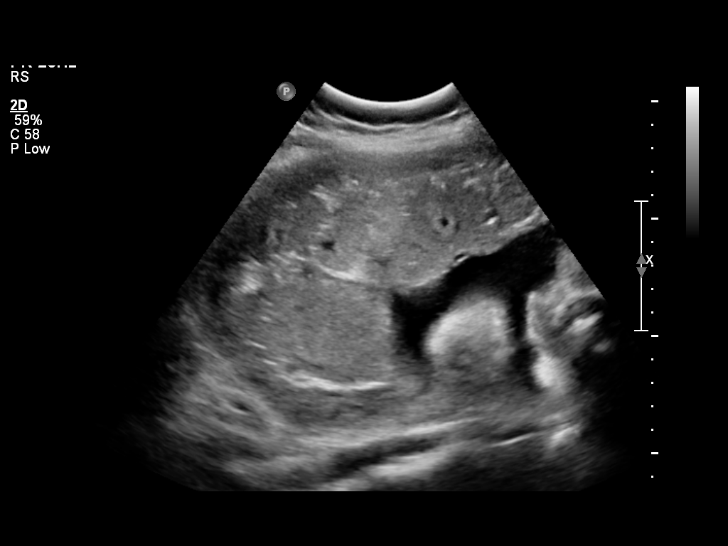
[im 7/24]
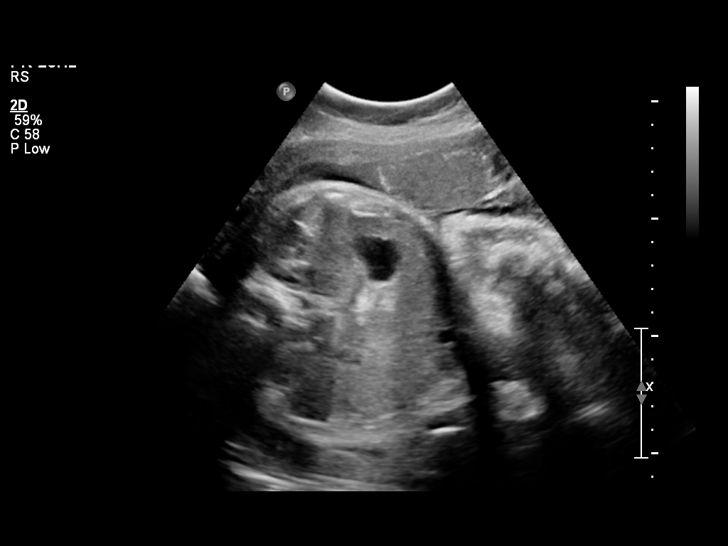
[im 8/24]
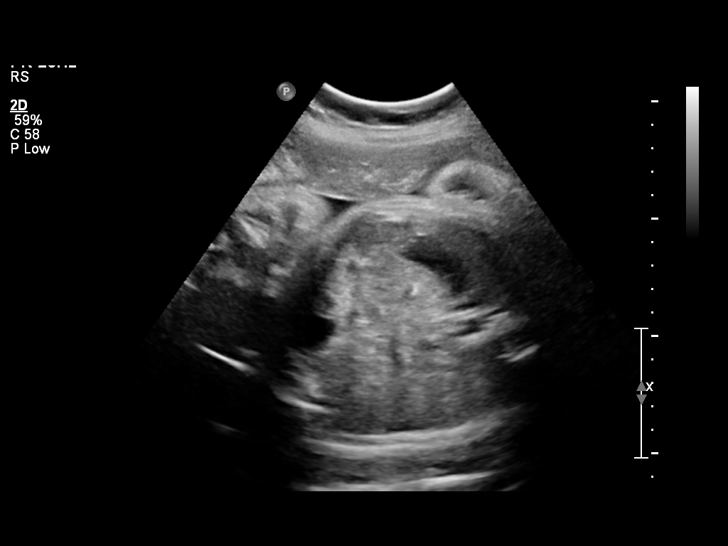
[im 10/24]
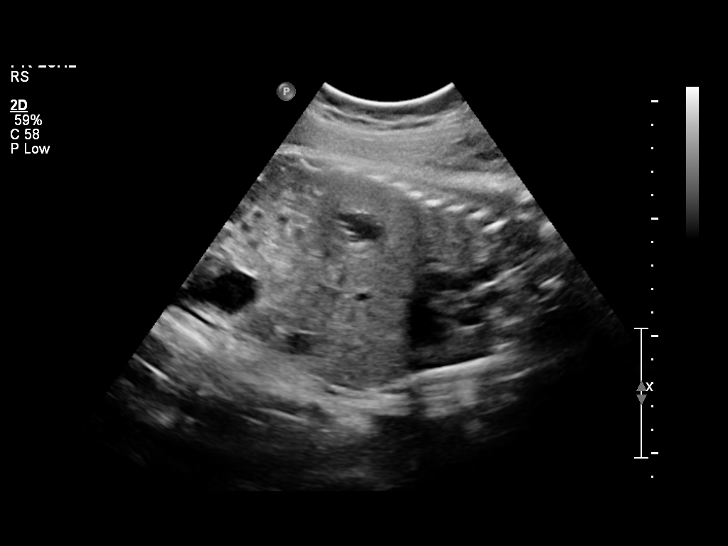
[im 12/24]
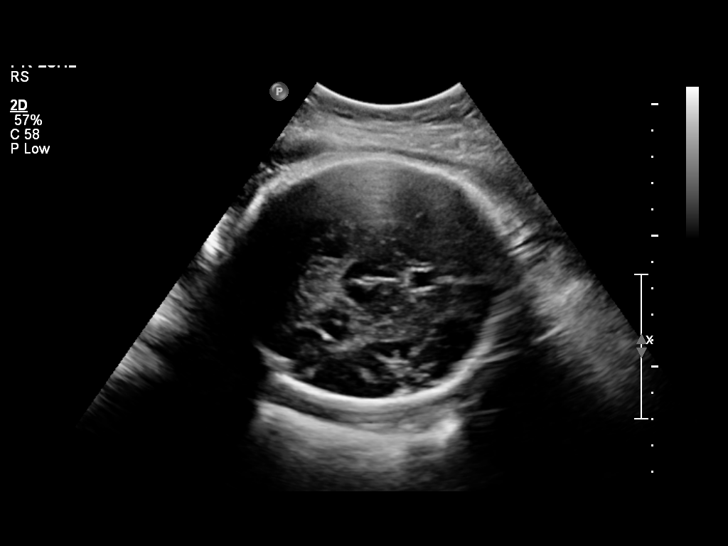
[im 13/24]
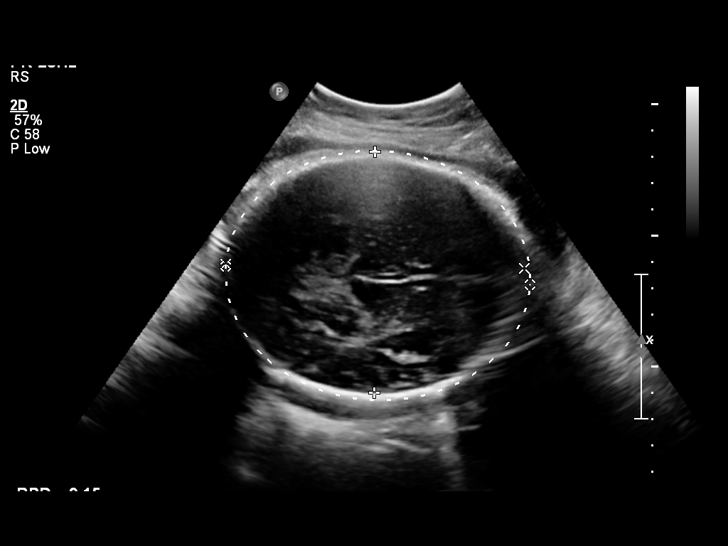
[im 15/24]
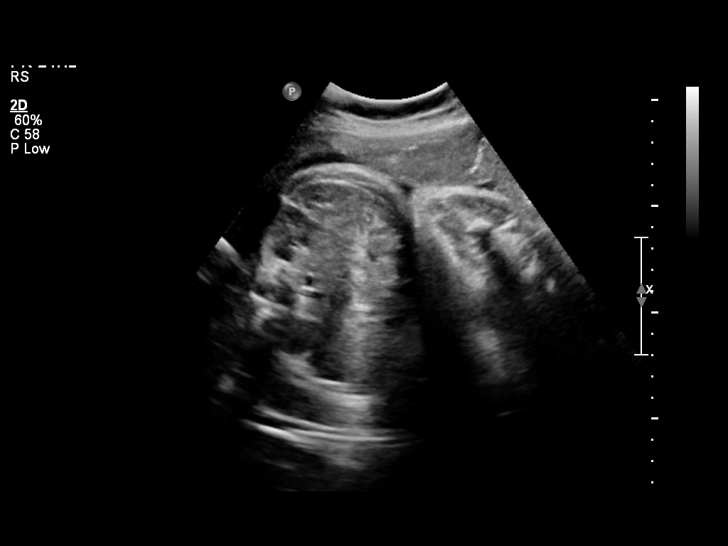
[im 17/24]
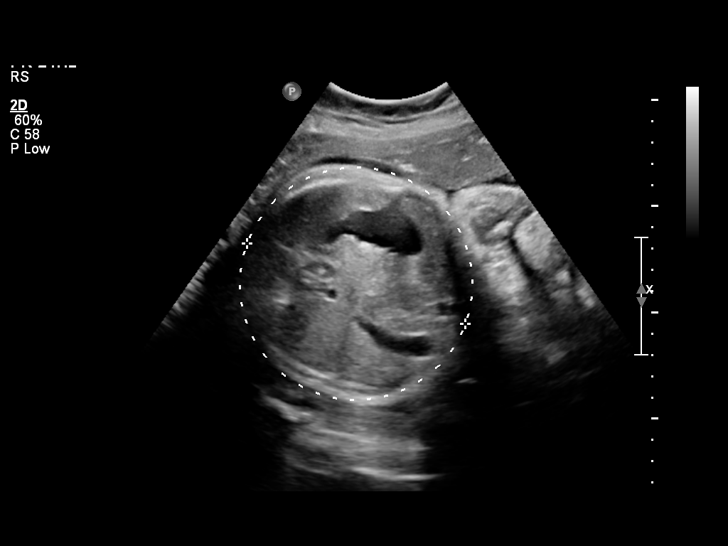
[im 19/24]
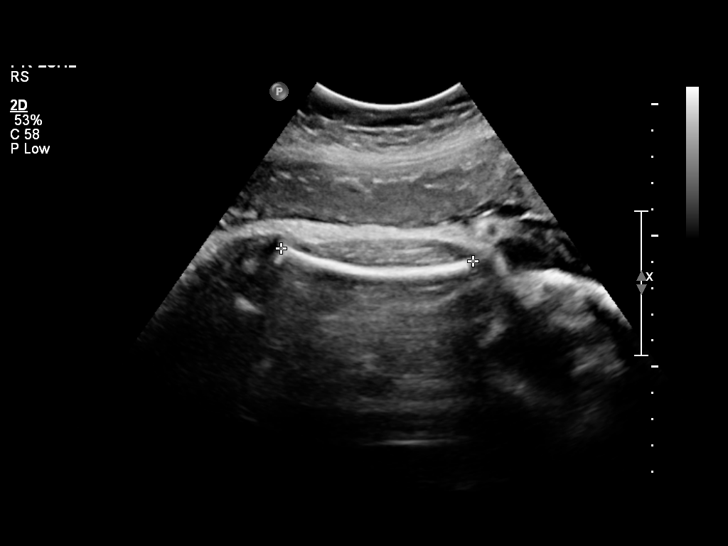
[im 20/24]
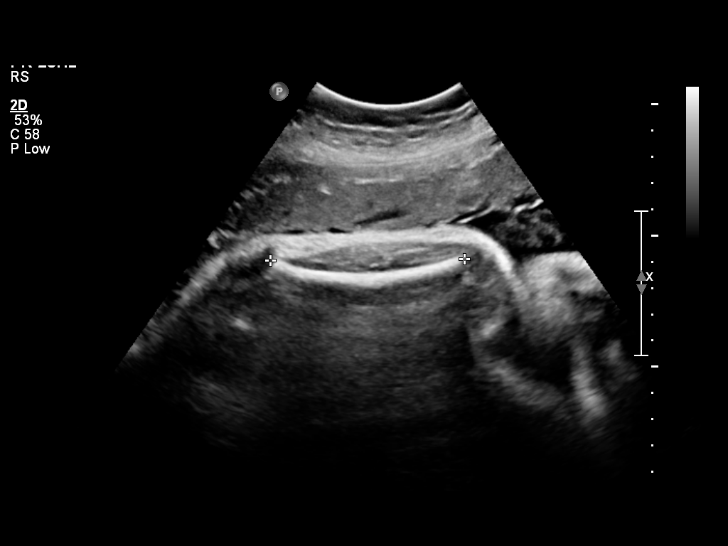
[im 22/24]
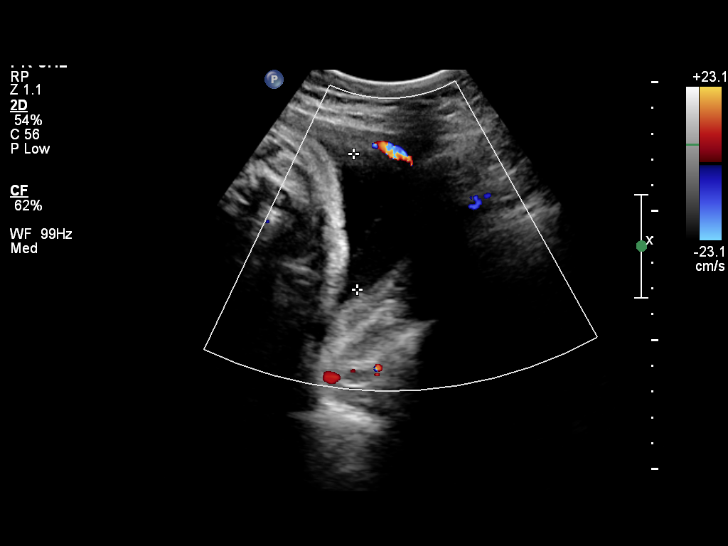
[im 24/24]
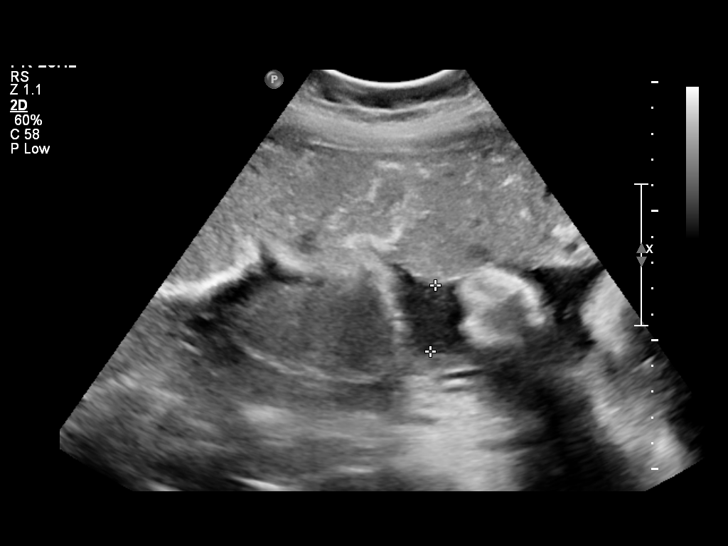

[14 of 24 positions shown; findings below may reference images not displayed]

IMPRESSION: See [facility] Obstetric US report.

## 2012-05-02 IMAGING — US US ABDOMEN COMPLETE
1 series · 14 of 25 positions shown · non-contrast
Comparison: Abdominal ultrasound 02/24/2010.

CLINICAL DATA: Abdominal and back pain.  Right upper quadrant
pain.  Elevated liver function test.

COMPLETE ABDOMINAL ULTRASOUND

[Series 1: us abdomen complete · 0.31mm/px · 14 of 77 slices shown]
[im 1/77]
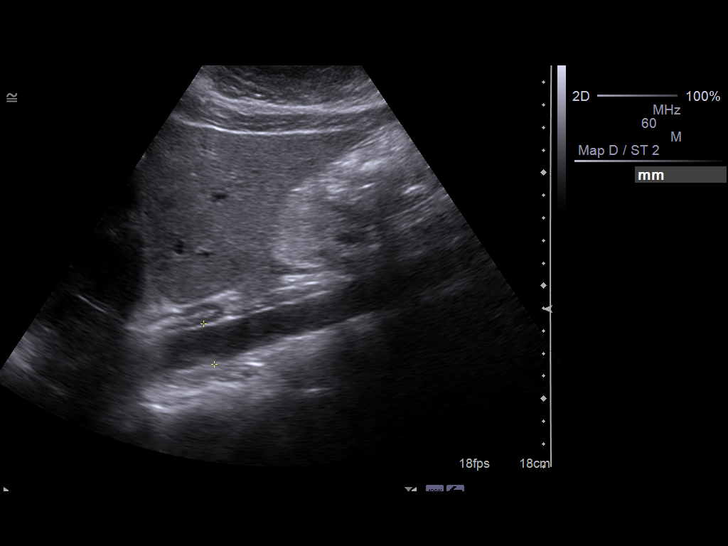
[im 7/77]
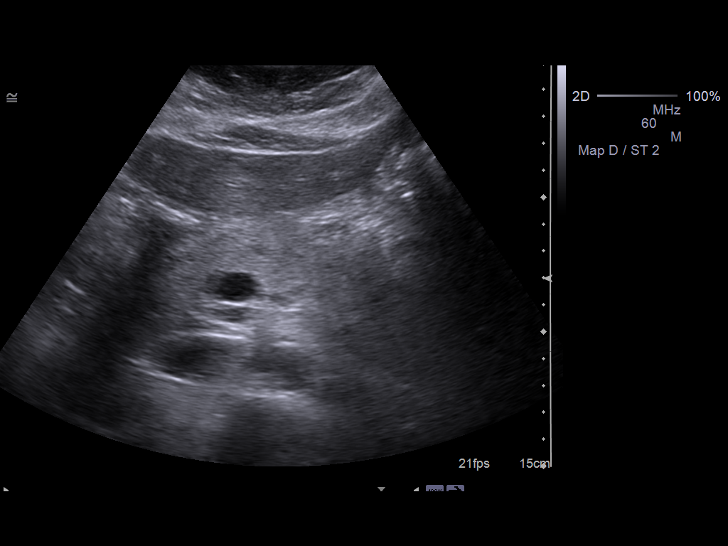
[im 13/77]
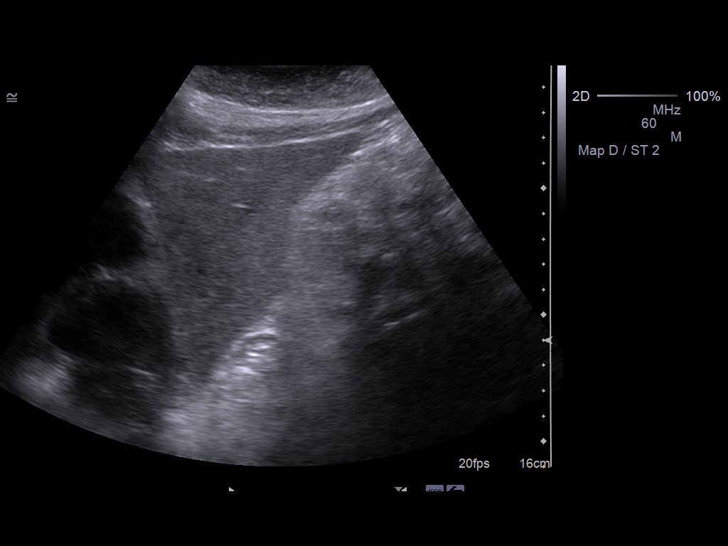
[im 20/77]
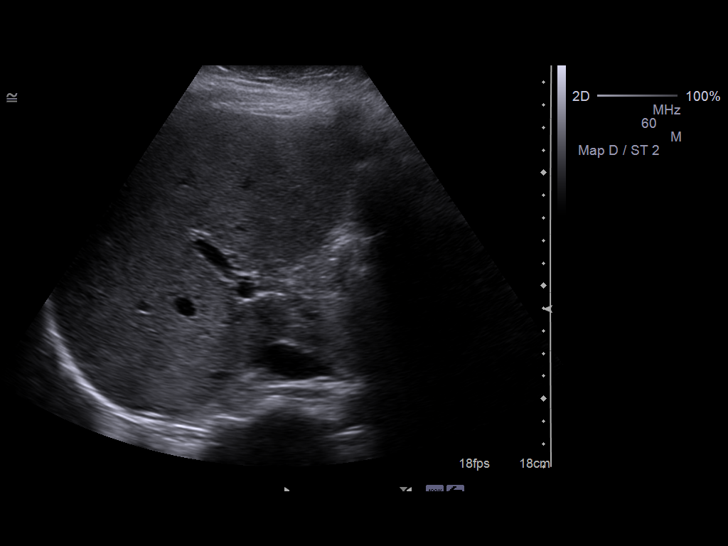
[im 26/77]
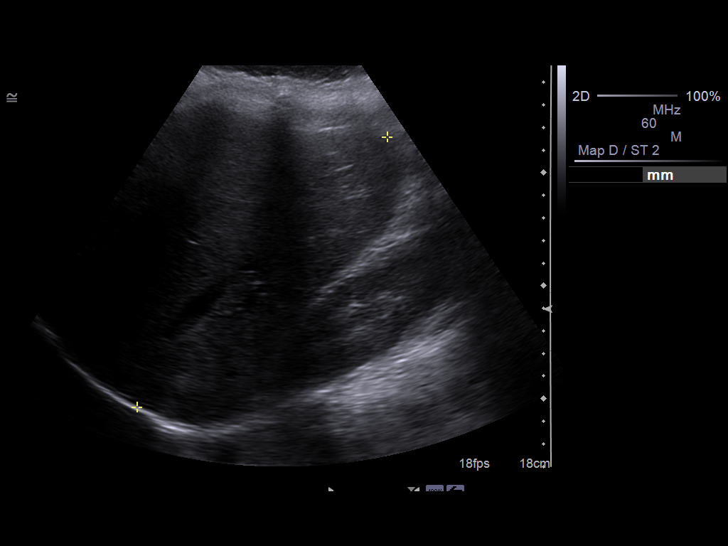
[im 29/77]
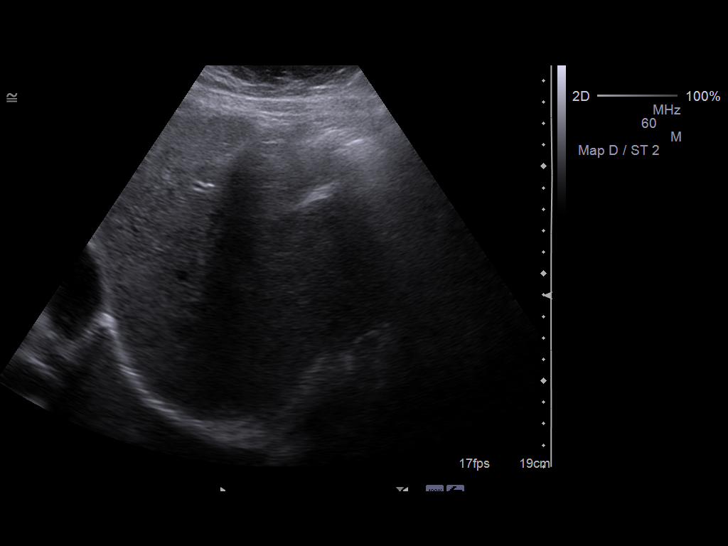
[im 35/77]
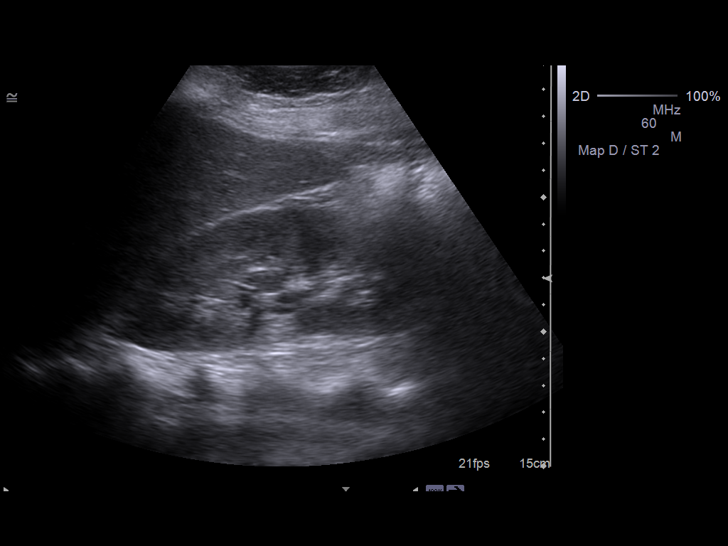
[im 42/77]
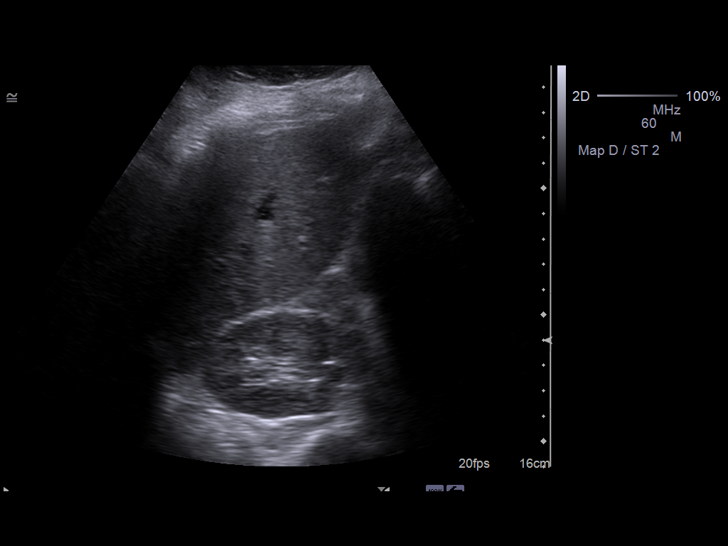
[im 48/77]
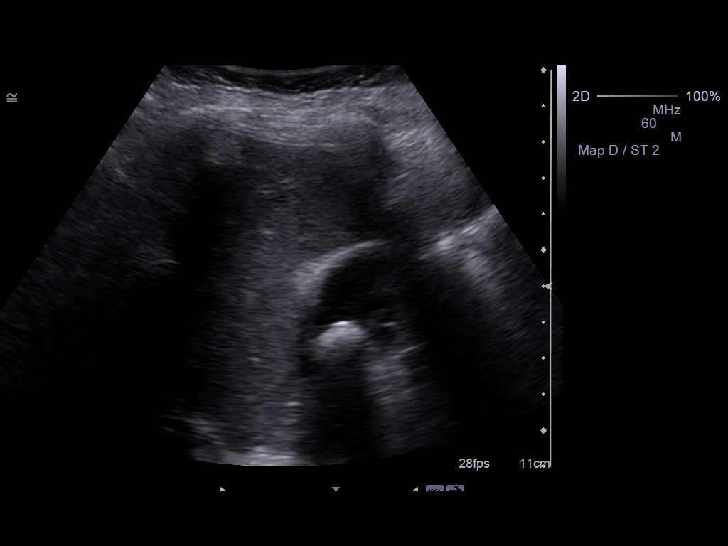
[im 51/77]
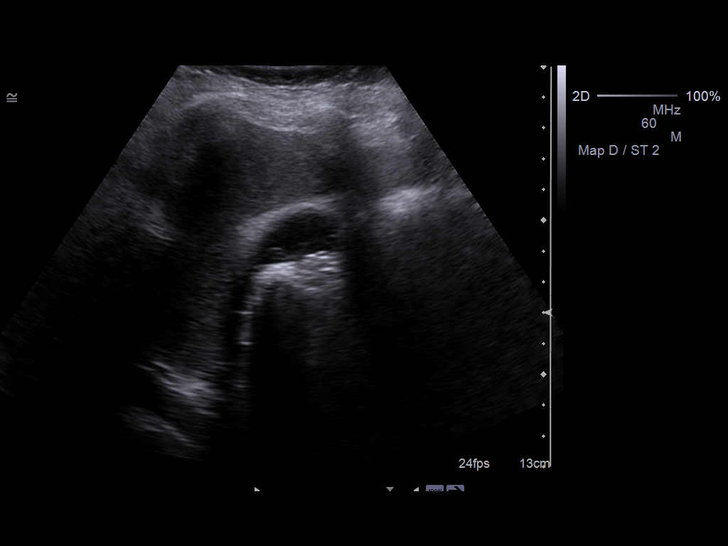
[im 58/77]
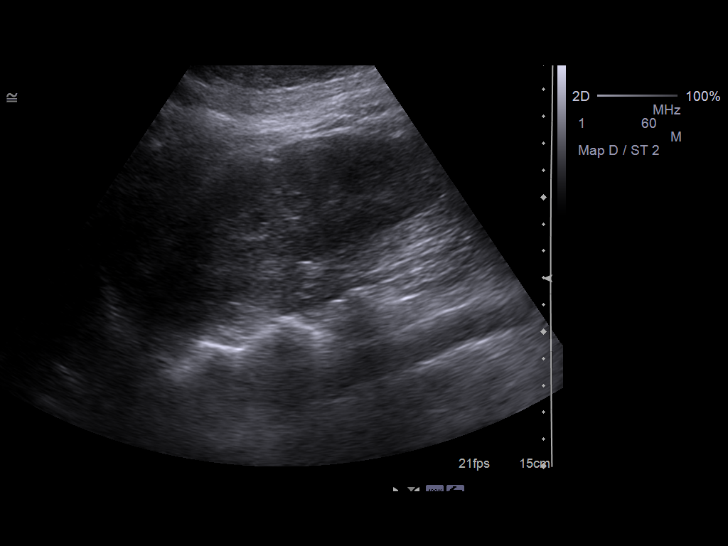
[im 64/77]
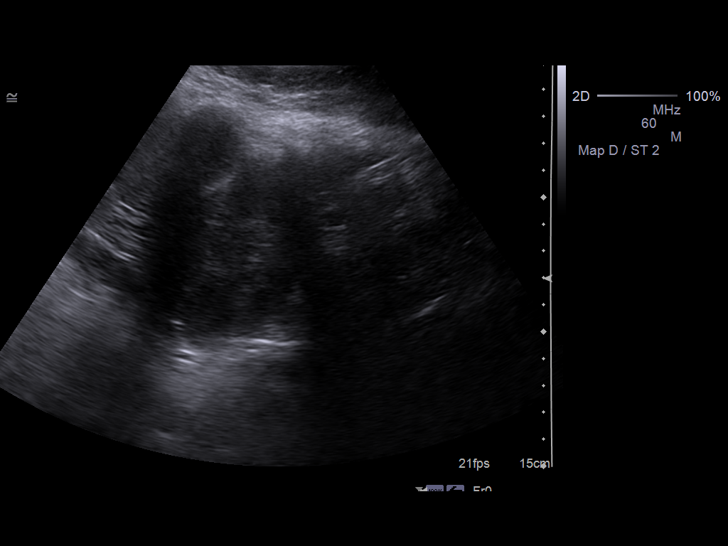
[im 70/77]
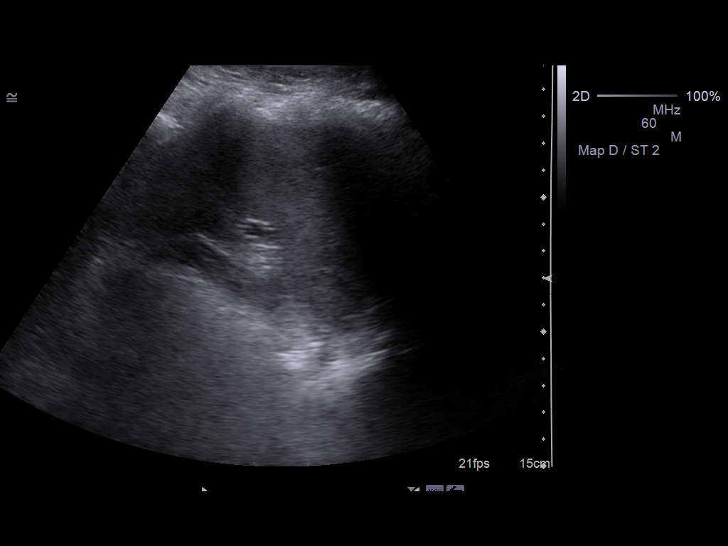
[im 77/77]
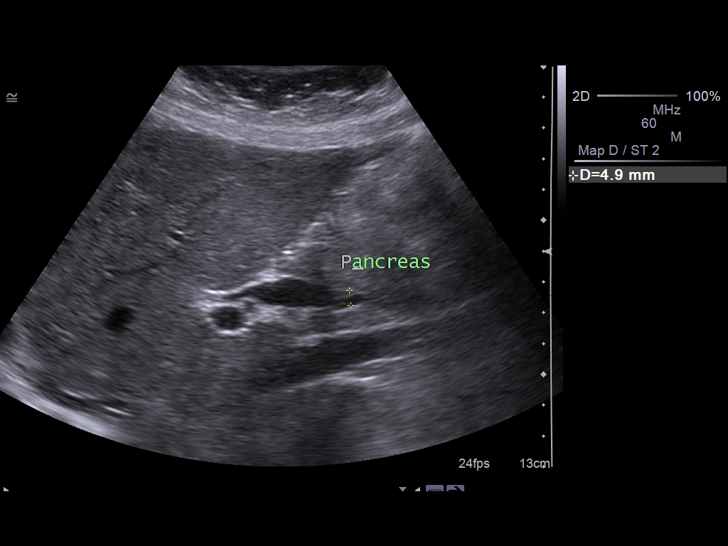

[14 of 25 positions shown; findings below may reference images not displayed]

FINDINGS: Gallbladder:  As on the prior examination, gallstones are present
with the largest identified measuring 1.5 cm.  There is no
gallbladder wall thickening or pericholecystic fluid.  Sonographer
reports negative Murphy's sign.

Common bile duct:  Measures 0.5 cm, normal.

Liver:  No focal lesion identified.  Within normal limits in
parenchymal echogenicity.

IVC:  Appears normal.

Pancreas:  No focal abnormality seen.

Spleen:  Measures 8.3 cm and appears normal.

Right Kidney:  Measures 12.9 cm and appears normal.

Left Kidney:  Measures 12.1 cm and appears normal.

Abdominal aorta:  No aneurysm identified.
IMPRESSION: Gallstones.  No evidence of cholecystitis.  No biliary dilatation.

## 2016-07-16 ENCOUNTER — Ambulatory Visit: Payer: Self-pay | Attending: Internal Medicine

## 2016-10-21 ENCOUNTER — Ambulatory Visit: Payer: Self-pay | Attending: Internal Medicine | Admitting: Internal Medicine

## 2016-10-21 VITALS — BP 118/83 | HR 89 | Temp 98.0°F | Wt 202.6 lb

## 2016-10-21 DIAGNOSIS — Z114 Encounter for screening for human immunodeficiency virus [HIV]: Secondary | ICD-10-CM

## 2016-10-21 DIAGNOSIS — K029 Dental caries, unspecified: Secondary | ICD-10-CM | POA: Insufficient documentation

## 2016-10-21 DIAGNOSIS — Z23 Encounter for immunization: Secondary | ICD-10-CM | POA: Insufficient documentation

## 2016-10-21 DIAGNOSIS — Z6836 Body mass index (BMI) 36.0-36.9, adult: Secondary | ICD-10-CM | POA: Insufficient documentation

## 2016-10-21 DIAGNOSIS — K8020 Calculus of gallbladder without cholecystitis without obstruction: Secondary | ICD-10-CM | POA: Insufficient documentation

## 2016-10-21 DIAGNOSIS — R739 Hyperglycemia, unspecified: Secondary | ICD-10-CM | POA: Insufficient documentation

## 2016-10-21 DIAGNOSIS — R42 Dizziness and giddiness: Secondary | ICD-10-CM | POA: Insufficient documentation

## 2016-10-21 DIAGNOSIS — Z0000 Encounter for general adult medical examination without abnormal findings: Secondary | ICD-10-CM

## 2016-10-21 LAB — CBC
HEMATOCRIT: 42.1 % (ref 35.0–45.0)
Hemoglobin: 13.4 g/dL (ref 11.7–15.5)
MCH: 22.9 pg — ABNORMAL LOW (ref 27.0–33.0)
MCHC: 31.8 g/dL — AB (ref 32.0–36.0)
MCV: 72.1 fL — ABNORMAL LOW (ref 80.0–100.0)
MPV: 9.7 fL (ref 7.5–12.5)
PLATELETS: 416 10*3/uL — AB (ref 140–400)
RBC: 5.84 MIL/uL — ABNORMAL HIGH (ref 3.80–5.10)
RDW: 19.6 % — AB (ref 11.0–15.0)
WBC: 7.4 10*3/uL (ref 3.8–10.8)

## 2016-10-21 LAB — CMP AND LIVER
ALBUMIN: 4.2 g/dL (ref 3.6–5.1)
ALK PHOS: 61 U/L (ref 33–115)
ALT: 17 U/L (ref 6–29)
AST: 21 U/L (ref 10–30)
BILIRUBIN DIRECT: 0.1 mg/dL (ref <=0.2)
BILIRUBIN TOTAL: 0.3 mg/dL (ref 0.2–1.2)
BUN: 9 mg/dL (ref 7–25)
CALCIUM: 9.7 mg/dL (ref 8.6–10.2)
CO2: 23 mmol/L (ref 20–31)
CREATININE: 0.62 mg/dL (ref 0.50–1.10)
Chloride: 103 mmol/L (ref 98–110)
Glucose, Bld: 88 mg/dL (ref 65–99)
Indirect Bilirubin: 0.2 mg/dL (ref 0.2–1.2)
Potassium: 4.4 mmol/L (ref 3.5–5.3)
Sodium: 136 mmol/L (ref 135–146)
Total Protein: 7.2 g/dL (ref 6.1–8.1)

## 2016-10-21 LAB — LIPID PANEL
CHOLESTEROL: 188 mg/dL (ref 125–200)
HDL: 32 mg/dL — ABNORMAL LOW (ref >=46)
LDL Cholesterol: 106 mg/dL (ref <130)
TRIGLYCERIDES: 249 mg/dL — AB (ref <150)
Total CHOL/HDL Ratio: 5.9 Ratio — ABNORMAL HIGH (ref <=5.0)
VLDL: 50 mg/dL — AB (ref <30)

## 2016-10-21 LAB — TSH: TSH: 2.11 m[IU]/L

## 2016-10-21 NOTE — Progress Notes (Signed)
  Pt states they are having dental problems. Pt states pain is a 7 gets worse when eating sweets or drinks something cold Pt states they feel dizzy when laying down fast.    Pt states 6 years ago when pregnant with son, they found gallstones, she did not have surgery then. Having more pain recently.

## 2016-10-21 NOTE — Progress Notes (Signed)
[name], is a 35 y.o. female  ZOX:[id number]  [id number]  DOB - 03/26/1981  CC: No chief complaint on file.      HPI: [name] is a 35 y.o. female here today to establish medical care.  New patient to our clinic. Significant past medical history of gallstones. States that she's been having more gallbladder problems lately whenever she eats fatty foods. She she's been trying to avoid fatty foods. She also mentions dental caries requesting a referral to the dentist of note.  She also s/o mild dizziness sometimes when she is walking or lying down, not associated with anything. Not associated with getting up. Denies the room spinning very transient and goes away.  Denies chest pain or palpitations.  Patient had an IUD placed about in March, about 7 years ago this coming March. She still has periods, but only every  3-4 months, quite heavily though.  Last Papsmear was about 2 years ago and negative per pt.  Patient has No headache, No chest pain, No abdominal pain - No Nausea, No new weakness tingling or numbness, No Cough - SOB.  Her weight remains unchanged. She hasn't noticed any weight gain or weight loss. Denies any constipation,   weakness or  Fatigue.  Denies any crying or sadness.  Denies smoking or drinking.  Interpreter was used to communicate directly with patient for the entire encounter including providing detailed patient instructions.   Review of Systems: Per history of present illness, otherwise all systems reviewed, essentially negative except for above   No Known Allergies No past medical history on file. No current outpatient prescriptions on file prior to visit.   No current facility-administered medications on file prior to visit.    No family history on file. Social History   Social History  . Marital status: Married    Spouse name: N/A  . Number of children: N/A  . Years of education: N/A   Occupational History  . Not on  file.   Social History Main Topics  . Smoking status: Not on file  . Smokeless tobacco: Not on file  . Alcohol use Not on file  . Drug use: Unknown  . Sexual activity: Not on file   Other Topics Concern  . Not on file   Social History Narrative  . No narrative on file    Objective:   Vitals:   10/21/16 1101  BP: 118/83  Pulse: 89  Temp: 98 F (36.7 C)    Filed Weights   10/21/16 1101  Weight: 202 lb 9.6 oz (91.9 kg)    BP Readings from Last 3 Encounters:  10/21/16 118/83  04/04/09 126/80  09/19/08 120/80   bmi 36.8  Physical Exam: Constitutional: Patient appears well-developed and well-nourished. No distress. AAOx3, morbid obese.  HENT: Normocephalic, atraumatic, External right and left ear normal. Oropharynx is clear and moist. TMs are clear. She has some dental caries but no active signs of dental infection. Eyes: Conjunctivae and EOM are normal. PERRL, no scleral icterus. Neck: Normal ROM. Neck supple. No JVD.  CVS: RRR, S1/S2 +, no murmurs, no gallops, no carotid bruit.  Pulmonary: Effort and breath sounds normal, no stridor, rhonchi, wheezes, rales.  Abdominal: Soft. Obese BS +, no distension, tenderness, rebound or guarding. Negative Murphy's sign. Musculoskeletal: Normal range of motion. No edema and no tenderness.  LE: bilat/ no c/c/e, pulses 2+ bilateral. Neuro: Alert. muscle tone coordination wnl. No cranial nerve deficit grossly. Skin: Skin is warm and dry. No rash  noted. Not diaphoretic. No erythema. No pallor. Psychiatric: Normal mood and affect. Behavior, judgment, thought content normal.  Lab Results  Component Value Date   WBC 9.9 05/03/2010   HGB 13.1 05/03/2010   HCT 39.3 05/03/2010   MCV 82.3 05/03/2010   PLT 302 05/03/2010   Lab Results  Component Value Date   CREATININE 0.59 05/03/2010   BUN 9 05/03/2010   NA 134 (L) 05/03/2010   K 3.6 05/03/2010   CL 106 05/03/2010   CO2 22 05/03/2010    No results found for: HGBA1C Lipid  Panel     Component Value Date/Time   CHOL 155 04/04/2009 2138   TRIG 118 04/04/2009 2138   HDL 37 (L) 04/04/2009 2138   CHOLHDL 4.2 Ratio 04/04/2009 2138   VLDL 24 04/04/2009 2138   LDLCALC 94 04/04/2009 2138       Depression screen PHQ 2/9 10/21/2016  Decreased Interest 3  Down, Depressed, Hopeless 3  PHQ - 2 Score 6  Altered sleeping 2  Tired, decreased energy 2  Change in appetite 3  Feeling bad or failure about yourself  3  Trouble concentrating 3  Moving slowly or fidgety/restless 3  Suicidal thoughts 3  PHQ-9 Score 25   abd [place] 5/11 IMPRESSION: Gallstones.  No evidence of cholecystitis.  No biliary dilatation.  Provider: Sharren Bridge Assessment and plan:   1. Calculus of gallbladder without cholecystitis without obstruction US prior 5/11 + GS, recd avoid fatty foods for now, chk lfts. No abd pain on exam. - Ambulatory referral to General Surgery  2. Dizziness - unclear etiology, will get basic wkup to r/d dehydration (doubtful) and anemia  3. Hyperglycemia - HgB A1c  4. Encounter for screening for HIV - HIV antibody (with reflex)  5. Dental cavities - Ambulatory referral to Dentistry  6. Healthcare maintenance - CMP and Liver - CBC - TSH - Lipid panel  7. Morbid obesity (HCC) Weight loss recd, low carb diet, increase exercise recd, info provided on low carb diet  8. Encounter for immunization - Flu Vaccine QUAD 36+ mos IM  9. Scored high on depression screening - however exam was unremarkable, pt denied sx to me when I asked. Will ask SW to f/u more on this.  Return in about 3 months (around 01/21/2017).  The patient was given clear instructions to go to ER or return to medical center if symptoms don't improve, worsen or new problems develop. The patient verbalized understanding. The patient was told to call to get lab results if they haven't heard anything in the next week.    This note has been created with Stage manager. Any transcriptional errors are unintentional.   Pete Glatter, MD, MBA/MHA [facility] [place], [place] [phone]   10/21/2016, 1:08 PM

## 2016-10-21 NOTE — Patient Instructions (Addendum)
Recommend < 50 grams carbohydrates /day   QUICK START PATIENT GUIDE TO LCHF/IF LOW CARB HIGH FAT / INTERMITTENT FASTING What is this diet and how does it work? o Insulin is a hormone made by your body that allows you to use sugar (glucose) from carbohydrates in the food you eat for energy or to store glucose (as fat) for future use  o Insulin levels need to be lowered in order to utilize our stored energy (fat) o Many struggling with obesity are insulin resistant and have high levels of insulin o This diet works to lower your insulin in two ways o Fasting - allows your insulin levels to naturally decrease  o Avoiding carbohydrates - carbs trigger increase in insulin Low Carb Healthy Fat (LCHF) o Get a free app for your phone, such as MyFitnessPal, to help you track your macronutrients (carbs/protein/fats) and to track your weight and body measurements to see your progress o Set your goal for around 10% carbs/20% protein/70% fat o A good starting goal for amount of net carbs per day is 50 grams (some will aim for 20 grams) o "Net carbs" refers to total grams of carbs minus grams of fiber (as fiber is not typically absorbed). For example, if a food has 5g total carb and 3g fiber, that would be 2g net carbs o Increase healthy fats - eg. olive oil, eggs, nuts, avocado, cheese, butter, coconut, meats, fish o Avoid high carb foods - eg. bread, pasta, potatoes, rice, cookies, soda, juice, anything sugary o Buy full-fat ingredients (avoid low-fat versions, which often have more sugar) o No need to count calories, but pay close attention to grams of carbs on labels Intermittent Fasting (IF) o "Fasting" is going a period of time without eating - it helps to stay busy and well-hydrated o Purpose of fasting is to allow insulin levels to drop as low as possible, allowing your body to switch into fat-burning mode o With this diet there are many approaches to fasting, but 16:8 and 24hr fasts are commonly  used o 16:8 fast, usually 5-7 days a week - Fasting for 16 hours of the day, then eating all meals for the day over course of 8 hours. o 24 hour fast, usually 1-3 days a week - Typically eating one meal a day, then fasting until the next day. Plenty of fluids (and some salt to help you hold onto fluids) are recommended during longer fasts.  o During fasts certain beverages are still acceptable - water, sparkling water, bone broth, black tea or coffee, or tea/coffee with small amount of heavy whipping cream Special note for those on diabetic medications o Discuss your medications with your physician. You may need to hold your medication or adjust to only taking when eating. Diabetics should keep close track of their blood sugars when making any changes to diet/meds, to ensure they are staying within normal limits For more info about LCHF/IF o Watch "Therapeutic Fasting - Solving the Two-Compartment Problem" video by Dr. Sharman Cheek on YouTube ([email or web address]) for a great intro to these concepts o Read "The Obesity Code" and/or "The Complete Guide to Fasting" by Dr. Sharman Cheek o Go to www.dietdoctor.com for explanations, recipes, and infographics about foods to eat/avoid EXAMPLES TO GET STARTED Fasting Beverages -water (can add  tsp Pink Himalayan salt once or twice a day to help stay hydrated for longer fasts) -Sparkling water (such as [employer] or similar; avoid any with artificial sweeteners)  -Bone broth (multiple recipes available online  or can buy pre-made) -Tea or Coffee (Adding heavy whipping cream or coconut oil to your tea or coffee can be helpful if you find yourself getting too hungry during the fasts. Can also add cinnamon for flavor. Or "bulletproof coffee.") Low Carb Healthy Fat Breakfast (if not fasting) -eggs in butter or olive oil with avocado -omelet with veggies and cheese  Lunch -hamburger with cheese and avocado wrapped in lettuce (no bun, no  ketchup) -meat and cheese wrapped in lettuce (can dip in mustard or olive oil/vinegar/mayo) -salad with meat/cheese/nuts and higher fat dressing (vinaigrette or Ranch, etc) -tuna salad lettuce wrap -taco meat with cheese, sour cream, guacamole, cheese over lettuce  Dinner -steak with herb butter or Barnaise sauce -"Fathead" pizza (uses cheese and almond flour for the dough - several recipes available online) -roasted or grilled chicken with skin on, with low carb sauce (buffalo, garlic butter, alfredo, pesto, etc) -baked salmon with lemon butter -chicken alfredo with zucchini noodles -[place] butter chicken with low carb garlic naan -egg roll in a bowl  Side Dishes -mashed cauliflower (homemade or available in freezer section) -roast vegetables (green veggies that grow above ground rather than root veggies) with butter or cheese -Caprese salad (fresh mozzarella, tomato and basil with olive oil) -homemade low-carb coleslaw Snacks/Desserts (try to avoid unnecessary snacking and sweets in general) -celery or cucumber dipped in guacamole or sour cream dip -cheese and meat slices  -raspberries with whipped cream (can make homemade with no sugar added) -low carb [place] butter cake  AVOID - sugar, diet/regular soda, potatoes, breads, rice, pasta, candy, cookies, cakes, muffins, juice, high carb fruit (bananas, grapes), beer, ketchup, barbeque and other sweet sauces  - Mantenimiento de la salud - Mujeres (Health Maintenance, Female) Un estilo de vida saludable y los cuidados preventivos pueden favorecer considerablemente a la salud y Musician. Pregunte a su mdico cul es el cronograma de exmenes peridicos apropiado para usted. Esta es una buena oportunidad para consultarlo sobre cmo prevenir enfermedades y [place] sano. Adems de los controles, hay muchas otras cosas que puede hacer usted mismo. Los expertos han realizado numerosas investigaciones [employer]itor cambios en el estilo  de vida y las medidas de prevencin que, [place], lo ayudarn a mantenerse sano. Solicite a su mdico ms informacin. EL PESO Y LA DIETA  Consuma una dieta saludable.  Asegrese de [employer] Stores verduras, frutas, productos lcteos de bajo contenido de [place] y Advertising account planner.  No consuma muchos alimentos de alto contenido de grasas slidas, azcares agregados o sal.  Realice actividad fsica con regularidad. Esta es una de las prcticas ms importantes que puede hacer por su salud.  La mayora de los adultos deben hacer ejercicio durante al menos 189mnutos por semana. El ejercicio debe aumentar la frecuencia cardaca y pActorla transpiracin (ejercicio de [place].  La mayora de los adultos tambin deben hField seismologistejercicios de elongacin al mToysRusveces a la semana. Agregue esto al su plan de ejercicio de intensidad moderada. Mantenga un peso saludable.  El ndice de masa corporal ([facility] es una medida que puede utilizarse para identificar posibles problemas de pStewartsville Proporciona una estimacin de la grasa corporal basndose en el peso y la altura. Su mdico puede ayudarle a dRadiation protection practitionerINevada Cityy a lScientist, forensico mTheatre managerun peso saludable.  Para las mujeres de 20aos o ms:  Un IRegional Hospital For Respiratory & Complex Caremenor de 18,5 se considera bajo peso.  Un IInterstate Ambulatory Surgery Centerentre 18,5 y 24,9 es normal.  Un ILindsay House Surgery Center LLCentre 25 y 29,9 se  considera sobrepeso.  Un IMC de 30 o ms se considera obesidad. Observe los niveles de colesterol y lpidos en la sangre.  Debe comenzar a English as a second language teacher de lpidos y Research officer, trade union en la sangre a los 20aos y luego repetirlos cada 64aos.  Es posible que Automotive engineer los niveles de colesterol con mayor frecuencia si:  Sus niveles de lpidos y colesterol son altos.  Es mayor de 50aos.  Presenta un alto riesgo de padecer enfermedades cardacas. DETECCIN DE CNCER  Cncer de pulmn  Se recomienda realizar exmenes de deteccin de cncer de pulmn a personas adultas  entre 12 y 73 aos que estn en riesgo de Horticulturist, commercial de pulmn por sus antecedentes de consumo de tabaco.  Se recomienda una tomografa computarizada de baja dosis de los [employer] aos a las personas que:  Fuman actualmente.  Hayan dejado el hbito en algn momento en los ltimos 15aos.  Hayan fumado durante 30aos un paquete diario. Un paquete-ao equivale a fumar un promedio de un paquete de cigarrillos diario durante un ao.  Los exmenes de deteccin anuales deben continuar hasta que hayan pasado 15aos desde que dej de fumar.  Ya no debern realizarse si tiene un problema de salud que le impida recibir tratamiento para Science writer de pulmn. Cncer de mama  Practique la autoconciencia de la mama. Esto significa reconocer la apariencia normal de sus mamas y cmo las siente.  Tambin significa realizar autoexmenes regulares de [employer]. Informe a su mdico sobre cualquier cambio, sin importar cun pequeo sea.  Si tiene entre 20 y 46 aos, un mdico debe realizarle un examen clnico de las mamas como parte del examen regular de [place], cada 1 a 3aos.  Si tiene 40aos o ms, debe Information systems manager clnico de las Microsoft. Tambin considere realizarse una [place] (Center) todos los [place].  Si tiene antecedentes familiares de cncer de mama, hable con su mdico para someterse a un estudio gentico.  Si tiene alto riesgo de Chief Financial Officer de mama, hable con su mdico para someterse a Public house manager y [employer].  La evaluacin del gen del cncer de mama (BRCA) se recomienda a mujeres que tengan familiares con cnceres relacionados con el BRCA. Los cnceres relacionados con el BRCA incluyen los siguientes:  Mama.  Ovario.  Trompas.  Cnceres de peritoneo.  Los resultados de la evaluacin determinarn la necesidad de asesoramiento gentico y de [place] de BRCA1 y BRCA2. Cncer de cuello del  tero El mdico puede recomendarle que se haga pruebas peridicas de deteccin de cncer de los rganos de la pelvis (ovarios, tero y vagina). Estas pruebas incluyen un examen plvico, que abarca controlar si se produjeron cambios microscpicos en la superficie del cuello del tero (prueba de Papanicolaou). Pueden recomendarle que se haga estas pruebas cada 3aos, a partir de los 21aos.  A las mujeres que tienen entre 30 y 48aos, los mdicos pueden recomendarles que se sometan a exmenes plvicos y pruebas de Papanicolaou cada 59aos, o a la prueba de Papanicolaou y el examen plvico en combinacin con estudios de deteccin del virus del papiloma humano (VPH) cada 5aos. Algunos tipos de VPH aumentan el riesgo de Chief Financial Officer de cuello del tero. La prueba para la deteccin del VPH tambin puede realizarse a mujeres de cualquier edad cuyos resultados de la prueba de Papanicolaou no sean claros.  Es posible que otros mdicos no recomienden exmenes de deteccin a mujeres no embarazadas que  se consideran sujetos de bajo riesgo de Chief Financial Officer de pelvis y que no tienen sntomas. Pregntele al mdico si un examen plvico de deteccin es adecuado para usted.  Si ha recibido un tratamiento para Science writer cervical o una enfermedad que podra causar cncer, necesitar realizarse una prueba de Papanicolaou y controles durante al menos 64 aos de concluido el [place]. Si no se ha hecho el Papanicolaou con regularidad, debern volver a evaluarse los factores de riesgo (como tener un nuevo compaero sexual), para Teacher, adult education si debe realizarse los estudios nuevamente. Algunas mujeres sufren problemas mdicos que aumentan la probabilidad de Museum/gallery curator cncer de cuello del tero. En estos casos, el mdico podr [employer] se realicen controles y pruebas de Papanicolaou con ms frecuencia. Cncer colorrectal  Este tipo de cncer puede detectarse y a menudo prevenirse.  Por lo general, los estudios de  rutina se deben Medical laboratory scientific officer a Field seismologist a Proofreader de los 28 aos y [place] 36 aos.  Sin embargo, el mdico podr aconsejarle que lo haga antes, si tiene factores de riesgo para el cncer de colon.  Tambin puede recomendarle que use un kit de prueba para Hydrologist en la materia fecal.  Es posible que se use una pequea cmara en el extremo de un tubo para examinar directamente el colon (sigmoidoscopia o colonoscopia) a fin de Hydrographic surveyor formas tempranas de cncer colorrectal.  Los exmenes de rutina generalmente comienzan a los 69aos.  El examen directo del colon se debe repetir cada 5 a 10aos hasta los 75aos. Sin embargo, es posible que se realicen exmenes con mayor frecuencia, si se detectan formas tempranas de plipos precancerosos o pequeos bultos. Cncer de piel  Revise la piel de la cabeza a los pies con regularidad.  Informe a su mdico si aparecen nuevos lunares o los que tiene se modifican, especialmente en su forma y color.  Tambin notifique al mdico si tiene un lunar que es ms grande que el tamao de una goma de lpiz.  Siempre use pantalla solar. Aplique pantalla solar de [place] y repetida a lo largo del Training and development officer.  Protjase usando mangas y [employer], un sombrero de ala ancha y gafas para el sol, siempre que se encuentre en el exterior. ENFERMEDADES CARDACAS, DIABETES E HIPERTENSIN ARTERIAL   La hipertensin arterial causa enfermedades cardacas y [place] el riesgo de ictus. La hipertensin arterial es ms probable en los siguientes casos:  Las personas que tienen la presin arterial en el extremo del rango normal (100-139/85-89 mm Hg).  Las personas con sobrepeso u obesidad.  Las Retail banker.  Si usted tiene entre 18 y 39 aos, debe medirse la presin arterial cada 3 a 5 aos. Si usted tiene 40 aos o ms, debe medirse la presin arterial [employer]lett[employer]. Debe medirse la presin arterial dos veces: una vez cuando est en un hospital o  una clnica y la otra vez cuando est en otro sitio. Registre el promedio de [employer]. Para controlar su presin arterial cuando no est en un hospital o [place], puede usar lo siguiente:  Ardelia Mems mquina automtica para medir la presin arterial en una farmacia.  Un monitor para medir la presin arterial en el hogar.  Si tiene entre 14 y 92 aos, consulte a su mdico si debe tomar aspirina para prevenir el ictus.  Realcese exmenes de deteccin de la diabetes con regularidad. Esto incluye la toma de [place] de sangre para controlar el nivel de azcar en la sangre durante  el ayuno.  Si tiene un peso normal y un bajo riesgo de padecer diabetes, realcese este anlisis cada tres aos despus de los 45aos.  Si tiene sobrepeso y un alto riesgo de padecer diabetes, considere someterse a este anlisis antes o con mayor frecuencia. PREVENCIN DE INFECCIONES  HepatitisB  Si tiene un riesgo ms alto de Museum/gallery curator hepatitis B, debe someterse a un examen de deteccin de este virus. Se considera que tiene un alto riesgo de Museum/gallery curator hepatitis B si:  Naci en un pas donde la hepatitis B es frecuente. Pregntele a su mdico qu pases son considerados de Public affairs consultant.  Sus padres nacieron en un pas de alto riesgo y usted no recibi una vacuna que lo proteja contra la hepatitis B (vacuna contra la hepatitis B).  [place].  [place] agujas para inyectarse drogas.  Vive con alguien que tiene hepatitis B.  Ha tenido sexo con alguien que tiene hepatitis B.  Recibe tratamiento de hemodilisis.  Toma ciertos medicamentos para el cncer, trasplante de rganos y afecciones autoinmunitarias. Hepatitis C  Se recomienda un anlisis de [place] para:  Todos los que nacieron entre 1944 y [id number].  Todas las personas que tengan un riesgo de haber contrado hepatitis C. Enfermedades de transmisin sexual (ETS).  Debe realizarse pruebas de deteccin de enfermedades de transmisin sexual  (ETS), incluidas gonorrea y clamidia si:  Es sexualmente activo y es menor de 24aos.  Es mayor de 24aos, y Investment banker, operational informa que corre riesgo de tener este tipo de infecciones.  La actividad sexual ha cambiado desde que le hicieron la ltima prueba de deteccin y tiene un riesgo mayor de Best boy clamidia o Radio broadcast assistant. Pregntele al mdico si usted tiene riesgo.  Si no tiene el VIH, pero corre riesgo de infectarse por el virus, se recomienda tomar diariamente un medicamento recetado para evitar la infeccin. Esto se conoce como profilaxis previa a la exposicin. Se considera que est en riesgo si:  Es [place] sexualmente y no [place] preservativos habitualmente o no conoce el estado del VIH de sus Advertising copywriter.  Se inyecta drogas.  Es [place] sexualmente con Ardelia Mems pareja que tiene VIH. Consulte a su mdico para saber si tiene un alto riesgo de infectarse por el VIH. Si opta por comenzar la profilaxis previa a la exposicin, primero debe realizarse anlisis de deteccin del VIH. Luego, le harn anlisis cada 40mses mientras est tomando los medicamentos para la profilaxis previa a la exposicin.  EEast [facility]  Si es premenopusica y puede quedar e[place]wood solicite a su mdico asesoramiento previo a la concepcin.  Si puede quedar embarazada, tome 400 a [id number](mcg) de cido fAnheuser-Busch  Si desea evitar el embarazo, hable con su mdico sobre el control de la natalidad (anticoncepcin). OSTEOPOROSIS Y MENOPAUSIA   La osteoporosis es una enfermedad en la que los huesos pierden los minerales y la fuerza por el avance de la edad. El resultado pueden ser fracturas graves en los h[place] El riesgo de osteoporosis puede identificarse con uArdelia Memsprueba de densidad sea.  Si tiene 65aos o ms, o si est en riesgo de sufrir osteoporosis y fracturas, pregunte a su mdico si debe someterse a exmenes.  Consulte a su mdico si debe tomar un suplemento de calcio o de vitamina D para  reducir el riesgo de osteoporosis.  La menopausia puede presentar ciertos sntomas fsicos y rGaffer  La terapia de reemplazo hormonal puede reducir algunos de estos sntomas y rGaffer Consulte a su mdico  para saber si la terapia de reemplazo hormonal es conveniente para usted.  INSTRUCCIONES PARA EL CUIDADO EN EL HOGAR   Realcese los estudios de rutina de la salud, dentales y de Public librarian.  [place].  No consuma ningn producto que contenga tabaco, lo que incluye cigarrillos, tabaco de Higher education careers adviser o Psychologist, sport and exercise.  Si est embarazada, no beba alcohol.  Si est amamantando, reduzca el consumo de alcohol y la frecuencia con la que consume.  Si es mujer y no est embarazada limite el consumo de alcohol a no ms de 1 medida por da. Una medida equivale a 12onzas de cerveza, 5onzas de vino o 1onzas de bebidas alcohlicas de alta graduacin.  No consuma drogas.  No comparta agujas.  Solicite ayuda a su mdico si necesita apoyo o informacin para abandonar las drogas.  Informe a su mdico si a menudo se siente deprimido.  Notifique a su mdico si alguna vez ha sido vctima de abuso o si no se siente seguro en su hogar.   Esta informacin no tiene Marine scientist el consejo del mdico. Asegrese de hacerle al mdico cualquier pregunta que tenga.   Document Released: 12/04/2011 Document Revised: 01/05/2015 Elsevier Interactive Patient Education 2016 [employer]. Influenza Virus Vaccine injection (Fluarix) Qu es este medicamento? La VACUNA ANTIGRIPAL ayuda a disminuir el riesgo de contraer la influenza, tambin conocida como la gripe. La vacuna solo ayuda a protegerle contra algunas cepas de influenza. Esta vacuna no ayuda a reducir Catering manager de contraer influenza pandmica H1N1. Este medicamento puede ser utilizado para otros usos; si tiene alguna pregunta consulte con su proveedor de atencin mdica o con su farmacutico. Qu le debo  informar a mi profesional de la salud antes de tomar este medicamento? Necesita saber si usted presenta alguno de los siguientes problemas o situaciones: -trastorno de sangrado como hemofilia -fiebre o infeccin -sndrome de Guillain-Barre u otros problemas neurolgicos -problemas del sistema inmunolgico -infeccin por el virus de la inmunodeficiencia humana (VIH) o SIDA -niveles bajos de plaquetas en la sangre -esclerosis mltiple -una Risk analyst o inusual a las vacunas antigripales, a los huevos, protenas de pollo, al ltex, a la gentamicina, a otros medicamentos, alimentos, colorantes o conservantes -si est embarazada o buscando quedar embarazada -si est amamantando a un beb Cmo debo utilizar este medicamento? Esta vacuna se administra mediante inyeccin por va intramuscular. Lo administra un profesional de [employer]	[place]. Recibir una copia de informacin escrita sobre la vacuna antes de cada vacuna. Asegrese de leer este folleto cada vez cuidadosamente. Este folleto puede cambiar con frecuencia. Hable con su pediatra para informarse acerca del uso de este medicamento en nios. Puede requerir atencin especial. Sobredosis: Pngase en contacto inmediatamente con un centro toxicolgico o una sala de urgencia si usted cree que haya tomado demasiado medicamento. ATENCIN: ConAgra Foods es solo para usted. No comparta este medicamento con nadie. Qu sucede si me olvido de una dosis? No se aplica en este caso. Qu puede interactuar con este medicamento? -quimioterapia o radioterapia -medicamentos que suprimen el sistema inmunolgico, tales como etanercept, anakinra, infliximab y adalimumab -medicamentos que tratan o previenen cogulos sanguneos, como warfarina -fenitona -medicamentos esteroideos, como la prednisona o la cortisona -teofilina -vacunas Puede ser que esta lista no menciona todas las posibles interacciones. Informe a su profesional de KB Home	[place] de [employer]  productos a base de hierbas, medicamentos de [place] o suplementos nutritivos que est tomando. Si usted fuma, consume bebidas alcohlicas o si [employer],  indqueselo tambin a su profesional de KB Home	[place]. Algunas sustancias pueden interactuar con su medicamento. A qu debo estar atento al usar [employer]? Informe a su mdico o a Barrister's clerk de la [employer] todos los efectos secundarios que persistan despus de 3 das. Llame a su proveedor de atencin mdica si se presentan sntomas inusuales dentro de las 6 semanas posteriores a la vacunacin. Es posible que todava pueda contraer la gripe, pero la enfermedad no ser tan fuerte como normalmente. No puede contraer la gripe de esta vacuna. La vacuna antigripal no le protege contra resfros u otras enfermedades que pueden causar [place]. Debe vacunarse cada ao. Qu efectos secundarios puedo tener al [employer] este medicamento? Efectos secundarios que debe informar a su mdico o a Barrister's clerk de la salud tan pronto como sea posible: -reacciones alrgicas como erupcin cutnea, picazn o urticarias, hinchazn de la cara, labios o lengua Efectos secundarios que, por lo general, no requieren atencin mdica (debe informarlos a su mdico o a su profesional de la salud si persisten o si son molestos): -fiebre -dolor de cabeza -molestias y dolores musculares -dolor, sensibilidad, enrojecimiento o Estate agent de la inyeccin -cansancio o debilidad Puede ser que esta lista no menciona todos los posibles efectos secundarios. Comunquese a su mdico por asesoramiento mdico [employer]. Usted puede informar los efectos secundarios a la FDA por telfono al 1-800-FDA-1088. Dnde debo guardar mi medicina? Esta vacuna se administra solamente en clnicas, farmacias, consultorio mdico u otro consultorio de un profesional de la salud y no Sports coach en su domicilio. ATENCIN: Este folleto es un  resumen. Puede ser que no cubra toda la posible informacin. Si usted tiene preguntas acerca de esta medicina, consulte con su mdico, su farmacutico o su profesional de Technical sales engineer.    2016, Elsevier/Gold Standard. (2010-06-18 15:31:40)

## 2016-10-22 ENCOUNTER — Telehealth: Payer: Self-pay

## 2016-10-22 ENCOUNTER — Telehealth: Payer: Self-pay | Admitting: Internal Medicine

## 2016-10-22 ENCOUNTER — Other Ambulatory Visit: Payer: Self-pay | Admitting: Internal Medicine

## 2016-10-22 DIAGNOSIS — R718 Other abnormality of red blood cells: Secondary | ICD-10-CM

## 2016-10-22 LAB — HIV ANTIBODY (ROUTINE TESTING W REFLEX): HIV 1&2 Ab, 4th Generation: NONREACTIVE

## 2016-10-22 NOTE — Telephone Encounter (Signed)
[employer] MilbankAlejandro Id: [id number] contacted pt to go over lab results pt is aware of results and doesn't have any questions or concerns

## 2016-10-22 NOTE — Telephone Encounter (Signed)
If you could contact pt and make her that she does have a lab appointment tomorrow to do further blood work for anemia. We won't know anything until she gets the lab drawn. Thank you

## 2016-10-22 NOTE — Telephone Encounter (Signed)
Patient called stated had labs done and something was abnormal in the labs. And she is taking prenatal vitamins wants to know if the vitamins could be causing labs to be abnormal. Please F/U

## 2016-10-23 ENCOUNTER — Ambulatory Visit: Payer: Self-pay | Attending: Internal Medicine

## 2016-10-23 DIAGNOSIS — R718 Other abnormality of red blood cells: Secondary | ICD-10-CM | POA: Insufficient documentation

## 2016-10-23 NOTE — Progress Notes (Signed)
Patient here for lab visit only 

## 2016-10-24 LAB — IRON,TIBC AND FERRITIN PANEL
%SAT: 6 % — AB (ref 11–50)
FERRITIN: 12 ng/mL (ref 10–154)
IRON: 24 ug/dL — AB (ref 40–190)
TIBC: 387 ug/dL (ref 250–450)

## 2016-10-27 ENCOUNTER — Other Ambulatory Visit: Payer: Self-pay | Admitting: Internal Medicine

## 2016-10-27 MED ORDER — FERROUS SULFATE 325 (65 FE) MG PO TABS: 325.0000 mg | ORAL_TABLET | Freq: Every day | ORAL | 3 refills | Status: DC

## 2016-11-17 MED FILL — FERROUS SULFATE 325 MG TAB: 325 (65 FE) | 30 days supply | Qty: 30 | Fill #0

## 2017-01-09 ENCOUNTER — Ambulatory Visit: Payer: Self-pay

## 2017-01-20 ENCOUNTER — Ambulatory Visit: Payer: Self-pay | Attending: Internal Medicine

## 2017-04-22 ENCOUNTER — Telehealth: Payer: Self-pay | Admitting: Internal Medicine

## 2017-04-22 NOTE — Telephone Encounter (Signed)
Sent Referral to [facility] in [place]. They accept the Cafa  And They will contact the patient to schedule an appointment . Thank You .

## 2017-04-22 NOTE — Telephone Encounter (Signed)
Pt. Called stating that she has been approved for CAFA. Pt. Was referred to General Surgery on 09/2016 but had to apply for financial assistance. Please f/u

## 2017-05-14 ENCOUNTER — Encounter: Payer: Self-pay | Admitting: Internal Medicine

## 2017-05-15 ENCOUNTER — Encounter: Payer: Self-pay | Admitting: Internal Medicine

## 2017-05-18 ENCOUNTER — Encounter: Payer: Self-pay | Admitting: Internal Medicine

## 2017-08-06 ENCOUNTER — Ambulatory Visit: Payer: Self-pay | Attending: Internal Medicine

## 2017-08-10 ENCOUNTER — Emergency Department (HOSPITAL_COMMUNITY): Payer: Self-pay

## 2017-08-10 ENCOUNTER — Encounter (HOSPITAL_COMMUNITY): Payer: Self-pay | Admitting: Emergency Medicine

## 2017-08-10 ENCOUNTER — Emergency Department (HOSPITAL_COMMUNITY)
Admission: EM | Admit: 2017-08-10 | Discharge: 2017-08-11 | Disposition: A | Discharge status: Home / Self Care | Payer: Self-pay | Attending: Emergency Medicine | Admitting: Emergency Medicine

## 2017-08-10 DIAGNOSIS — K8020 Calculus of gallbladder without cholecystitis without obstruction: Secondary | ICD-10-CM | POA: Insufficient documentation

## 2017-08-10 DIAGNOSIS — K8050 Calculus of bile duct without cholangitis or cholecystitis without obstruction: Secondary | ICD-10-CM

## 2017-08-10 DIAGNOSIS — R112 Nausea with vomiting, unspecified: Secondary | ICD-10-CM | POA: Insufficient documentation

## 2017-08-10 LAB — URINALYSIS, ROUTINE W REFLEX MICROSCOPIC
Bilirubin Urine: NEGATIVE
GLUCOSE, UA: NEGATIVE mg/dL
Hgb urine dipstick: NEGATIVE
Ketones, ur: NEGATIVE mg/dL
LEUKOCYTES UA: NEGATIVE
Nitrite: NEGATIVE
PH: 6 (ref 5.0–8.0)
Protein, ur: NEGATIVE mg/dL
Specific Gravity, Urine: 1.018 (ref 1.005–1.030)

## 2017-08-10 LAB — COMPREHENSIVE METABOLIC PANEL
ALT: 32 U/L (ref 14–54)
AST: 44 U/L — ABNORMAL HIGH (ref 15–41)
Albumin: 3.8 g/dL (ref 3.5–5.0)
Alkaline Phosphatase: 73 U/L (ref 38–126)
Anion gap: 8 (ref 5–15)
BUN: 8 mg/dL (ref 6–20)
CHLORIDE: 106 mmol/L (ref 101–111)
CO2: 23 mmol/L (ref 22–32)
Calcium: 9.1 mg/dL (ref 8.9–10.3)
Creatinine, Ser: 0.68 mg/dL (ref 0.44–1.00)
Glucose, Bld: 133 mg/dL — ABNORMAL HIGH (ref 65–99)
POTASSIUM: 3.7 mmol/L (ref 3.5–5.1)
SODIUM: 137 mmol/L (ref 135–145)
Total Bilirubin: 0.5 mg/dL (ref 0.3–1.2)
Total Protein: 7.3 g/dL (ref 6.5–8.1)

## 2017-08-10 LAB — CBC
HEMATOCRIT: 40.7 % (ref 36.0–46.0)
Hemoglobin: 13.7 g/dL (ref 12.0–15.0)
MCH: 24.8 pg — ABNORMAL LOW (ref 26.0–34.0)
MCHC: 33.7 g/dL (ref 30.0–36.0)
MCV: 73.7 fL — AB (ref 78.0–100.0)
Platelets: 334 10*3/uL (ref 150–400)
RBC: 5.52 MIL/uL — ABNORMAL HIGH (ref 3.87–5.11)
RDW: 16.7 % — AB (ref 11.5–15.5)
WBC: 9 10*3/uL (ref 4.0–10.5)

## 2017-08-10 LAB — LIPASE, BLOOD: LIPASE: 35 U/L (ref 11–51)

## 2017-08-10 LAB — I-STAT BETA HCG BLOOD, ED (MC, WL, AP ONLY): I-stat hCG, quantitative: <5 m[IU]/mL (ref <5)

## 2017-08-10 MED ORDER — GI COCKTAIL ~~LOC~~
30.0000 mL | Freq: Once | ORAL | Status: AC
  Administered 2017-08-10: 30 mL via ORAL
  Filled 2017-08-10: qty 30

## 2017-08-10 MED ORDER — OXYCODONE-ACETAMINOPHEN 5-325 MG PO TABS
1.0000 | ORAL_TABLET | Freq: Once | ORAL | Status: AC
  Administered 2017-08-10: 1 via ORAL
  Filled 2017-08-10: qty 1

## 2017-08-10 MED ORDER — ONDANSETRON 8 MG PO TBDP
8.0000 mg | ORAL_TABLET | Freq: Once | ORAL | Status: AC
  Administered 2017-08-10: 8 mg via ORAL
  Filled 2017-08-10: qty 1

## 2017-08-10 NOTE — ED Provider Notes (Signed)
[facility]-EMERGENCY DEPT Provider Note   CSN: [id number] Arrival date & time: 08/10/17  1828     History   Chief Complaint Chief Complaint  Patient presents with  . Flank Pain  . Abdominal Pain    HPI [name] is a 36 y.o. female.  36 y/o female presents for RUQ pain similar to past episodes of biliary colic. Initial onset was 7 years ago; now worse x 3 days. Pain aggravated with eating. Associated with nausea and vomiting x 1 this AM. No bowel changes, urinary symptoms, fever, or hx of abdominal surgeries. She has been taking ibuprofen with mild relief.   The history is provided by the patient. No language interpreter was used.  Abdominal Pain   This is a new problem. The current episode started more than 1 week ago. The problem occurs daily. The problem has been gradually worsening. The pain is associated with eating. The pain is located in the RUQ. The quality of the pain is sharp and burning. The pain is moderate. Associated symptoms include belching, nausea and vomiting. Pertinent negatives include fever, diarrhea, hematochezia, melena, dysuria, frequency and hematuria. The symptoms are aggravated by eating. The symptoms are relieved by NSAIDs. Her past medical history is significant for gallstones.    History reviewed. No pertinent past medical history.  Patient Active Problem List   Diagnosis Date Noted  . AMENORRHEA 04/04/2009  . EXCESSIVE OR FREQUENT MENSTRUATION 09/19/2008    No past surgical history on file.  OB History    No data available       Home Medications    Prior to Admission medications   Medication Sig Start Date End Date Taking? Authorizing Provider  ferrous sulfate (FERROUSUL) 325 (65 FE) MG tablet Take 1 tablet (325 mg total) by mouth daily with breakfast. Patient not taking: Reported on 08/10/2017 10/27/16   Pete Glatter, MD  oxyCODONE-acetaminophen (PERCOCET/ROXICET) 5-325 MG tablet Take 1-2 tablets by mouth every 6 (six) hours  as needed for severe pain. 08/11/17   Antony Madura, PA-C  pantoprazole (PROTONIX) 20 MG tablet Take 1 tablet (20 mg total) by mouth daily. 08/11/17   Antony Madura, PA-C  promethazine (PHENERGAN) 25 MG tablet Take 1 tablet (25 mg total) by mouth every 6 (six) hours as needed for nausea or vomiting. 08/11/17   Antony Madura, PA-C    Family History History reviewed. No pertinent family history.  Social History Social History  Substance Use Topics  . Smoking status: Not on file  . Smokeless tobacco: Not on file  . Alcohol use Not on file     Allergies   Patient has no known allergies.   Review of Systems Review of Systems  Constitutional: Negative for fever.  Gastrointestinal: Positive for abdominal pain, nausea and vomiting. Negative for diarrhea, hematochezia and melena.  Genitourinary: Negative for dysuria, frequency and hematuria.  Ten systems reviewed and are negative for acute change, except as noted in the HPI.    Physical Exam Updated Vital Signs BP 130/80 (BP Location: Right Arm)   Pulse 80   Temp 98.3 F (36.8 C) (Oral)   Resp 18   LMP 08/06/2017 (Approximate)   SpO2 98%   Physical Exam  Constitutional: She is oriented to person, place, and time. She appears well-developed and well-nourished. No distress.  Nontoxic and in NAD  HENT:  Head: Normocephalic and atraumatic.  Eyes: Conjunctivae and EOM are normal. No scleral icterus.  Neck: Normal range of motion.  Cardiovascular: Normal rate, normal heart sounds  and intact distal pulses.   Pulmonary/Chest: Effort normal. No respiratory distress. She has no wheezes.  Respirations even and unlabored. Lungs CTAB.  Abdominal: Soft. She exhibits no mass. There is tenderness. There is no guarding.  Mild RUQ TTP with negative Murphy's sign. No distension or peritonea signs. Abdomen soft, obese.  Musculoskeletal: Normal range of motion.  Neurological: She is alert and oriented to person, place, and time. She exhibits normal  muscle tone. Coordination normal.  GCS 15. Patient moving all extremities.  Skin: Skin is warm and dry. No rash noted. She is not diaphoretic. No erythema. No pallor.  Psychiatric: She has a normal mood and affect. Her behavior is normal.  Nursing note and vitals reviewed.    ED Treatments / Results  Labs (all labs ordered are listed, but only abnormal results are displayed) Labs Reviewed  COMPREHENSIVE METABOLIC PANEL - Abnormal; Notable for the following:       Result Value   Glucose, Bld 133 (*)    AST 44 (*)    All other components within normal limits  CBC - Abnormal; Notable for the following:    RBC 5.52 (*)    MCV 73.7 (*)    MCH 24.8 (*)    RDW 16.7 (*)    All other components within normal limits  LIPASE, BLOOD  URINALYSIS, ROUTINE W REFLEX MICROSCOPIC  I-STAT BETA HCG BLOOD, ED ([facility], [facility], [facility] ONLY)    EKG  EKG Interpretation None       Radiology [place] Abdomen Complete  Result Date: 08/10/2017 CLINICAL DATA:  Right upper quadrant pain with nausea and vomiting EXAM: ABDOMEN ULTRASOUND COMPLETE COMPARISON:  05/04/2010 FINDINGS: Gallbladder: Diffuse shadowing at the gallbladder fossa, consistent with multiple stones in contracted gallbladder. Wall thickness within normal limits at 2.6 mm. Negative sonographic Murphy. Common bile duct: Diameter: Upper normal in size at 6.6 mm Liver: Increased echogenicity consistent with fatty infiltration. No focal abnormality. IVC: No abnormality visualized. Pancreas: Poorly visible due to bowel gas Spleen: Size and appearance within normal limits. Right Kidney: Length: 10.4 cm. Echogenicity within normal limits. No mass or hydronephrosis visualized. Left Kidney: Length: 11.5 cm. Echogenicity within normal limits. No mass or hydronephrosis visualized. Abdominal aorta: No aneurysm visualized. Distal aorta obscured by bowel gas Other findings: None. IMPRESSION: 1. Multiple shadowing stones in the gallbladder, no significant wall thickening,  negative sonographic Murphy. Common duct diameter upper normal at 6.6 mm 2. Hepatic steatosis 3. Poor visualization of pancreas and distal aorta due to bowel gas Electronically Signed   By: Jasmine PangKim  Fujinaga M.D.   On: 08/10/2017 23:37    Procedures Procedures (including critical care time)  Medications Ordered in ED Medications  gi cocktail (Maalox,Lidocaine,Donnatal) (30 mLs Oral Given 08/10/17 2246)  ondansetron (ZOFRAN-ODT) disintegrating tablet 8 mg (8 mg Oral Given 08/10/17 2247)  oxyCODONE-acetaminophen (PERCOCET/ROXICET) 5-325 MG per tablet 1 tablet (1 tablet Oral Given 08/10/17 2247)     Initial Impression / Assessment and Plan / ED Course  I have reviewed the triage vital signs and the nursing notes.  Pertinent labs & imaging results that were available during my care of the patient were reviewed by me and considered in my medical decision making (see chart for details).     [age] year old female presents to the emergency department for right upper quadrant pain with associated N/V. She has known history of gallstones and states this feels similar. Symptoms worsening x 3 days. She does have evidence of cholelithiasis on ultrasound. No evidence of acute cholecystitis.  Laboratory workup also reassuring without leukocytosis, electrolyte derangements. Liver and kidney function preserved. She has had significant symptomatic relief with supportive management in the emergency department. Symptoms today consistent with biliary colic. Doubt acute cholecystitis at this time. Patient does not meet SIRS or sepsis criteria. Plan for continued outpatient follow-up with general surgery. Referral provided. Patient advised to avoid high-fat foods. Return precautions discussed and provided. Patient discharged in stable condition with no unaddressed concerns.   Final Clinical Impressions(s) / ED Diagnoses   Final diagnoses:  Biliary colic    New Prescriptions Discharge Medication List as of 08/11/2017  12:22 AM    START taking these medications   Details  oxyCODONE-acetaminophen (PERCOCET/ROXICET) 5-325 MG tablet Take 1-2 tablets by mouth every 6 (six) hours as needed for severe pain., Starting Tue 08/11/2017, Print    pantoprazole (PROTONIX) 20 MG tablet Take 1 tablet (20 mg total) by mouth daily., Starting Tue 08/11/2017, Print    promethazine (PHENERGAN) 25 MG tablet Take 1 tablet (25 mg total) by mouth every 6 (six) hours as needed for nausea or vomiting., Starting Tue 08/11/2017, Print         Antony Madura, PA-C 08/11/17 0133    Lorre Nick, MD 08/12/17 1627

## 2017-08-10 NOTE — ED Triage Notes (Signed)
Pt states she has had RUQ abdominal pain x 1 week with pain between her shoulder blades. Alert and oriented.

## 2017-08-11 MED ORDER — PROMETHAZINE HCL 25 MG PO TABS: 25.0000 mg | ORAL_TABLET | Freq: Four times a day (QID) | ORAL | 0 refills | Status: DC | PRN

## 2017-08-11 MED ORDER — OXYCODONE-ACETAMINOPHEN 5-325 MG PO TABS: 1.0000 | ORAL_TABLET | Freq: Four times a day (QID) | ORAL | 0 refills | Status: DC | PRN

## 2017-08-11 MED ORDER — PANTOPRAZOLE SODIUM 20 MG PO TBEC: 20.0000 mg | DELAYED_RELEASE_TABLET | Freq: Every day | ORAL | 0 refills | Status: DC

## 2017-08-11 NOTE — Discharge Instructions (Signed)
Tome Protonix diariamente segn lo prescrito. Recomendamos el uso de Phenergan para las nuseas y Percocet segn sea necesario para el dolor no controlado por ibuprofeno. No coma alimentos fritos o grasosos ya que esto Scientist, forensicempeorar su dolor. Haga un seguimiento con un cirujano general para hablar sobre la ciruga de la vescula biliar para su remocin.  Take Protonix daily as prescribed. We recommend use of Phenergan for nausea and Percocet as needed for pain not controlled by ibuprofen. Do not eat fried foods or greasy foods as this will make her pain worse. Follow-up with a general surgeon to discuss gallbladder surgery for removal.

## 2017-08-21 ENCOUNTER — Telehealth: Payer: Self-pay | Admitting: Internal Medicine

## 2017-08-21 NOTE — Telephone Encounter (Signed)
Patient called needing a new referral to general surgery pt was previously referred but referral has expired, Pt was in ED for same complication. Please f/up

## 2017-08-24 NOTE — Telephone Encounter (Signed)
Please advise on referral

## 2017-08-25 ENCOUNTER — Other Ambulatory Visit: Payer: Self-pay | Admitting: Internal Medicine

## 2017-08-25 DIAGNOSIS — K8050 Calculus of bile duct without cholangitis or cholecystitis without obstruction: Secondary | ICD-10-CM

## 2017-08-25 NOTE — Telephone Encounter (Signed)
Ordered

## 2017-09-10 ENCOUNTER — Encounter: Payer: Self-pay | Admitting: Surgery

## 2017-09-10 ENCOUNTER — Ambulatory Visit (INDEPENDENT_AMBULATORY_CARE_PROVIDER_SITE_OTHER): Payer: No Typology Code available for payment source | Admitting: Surgery

## 2017-09-10 VITALS — BP 137/85 | HR 96 | Temp 97.5°F | Ht 62.0 in | Wt 199.0 lb

## 2017-09-10 DIAGNOSIS — K8050 Calculus of bile duct without cholangitis or cholecystitis without obstruction: Secondary | ICD-10-CM

## 2017-09-10 NOTE — Progress Notes (Signed)
09/10/2017  Reason for Visit:  Biliary Colic  History of Present Illness: [name] is a 36 y.o. female with a long-standing history of biliary colic.  She was recently seen in the ED on 8/13 and was referred for surgical evaluation.  She reports that more recently her pain episodes have become more frequently.  She reports that the pain happens with any by mouth intake but particularly with greasy foods or spicy foods. The pain is in the right upper quadrant and radiates to the back. The pain episodes don't last more than an hour. Reports having had two episodes of emesis and also does have nausea.  Denies any heartburn.  Reports some occasional constipation. Denies any fevers or chills at home. In the emergency room she was given medications for pain and nausea. The pain medication does help with some of her pain control. She reports that the nausea medication makes her sleepy.  Past Medical History: Past Medical History:  Diagnosis Date  . Amenorrhea   . Biliary colic      Past Surgical History: Past Surgical History:  Procedure Laterality Date  . NO PAST SURGERIES      Home Medications: Prior to Admission medications   Medication Sig Start Date End Date Taking? Authorizing Provider  acetaminophen (TYLENOL) 500 MG tablet Take 500 mg by mouth every 6 (six) hours as needed.   Yes [provider]    Allergies: No Known Allergies  Social History:  reports that she has never smoked. She has never used smokeless tobacco. She reports that she does not drink alcohol or use drugs.   Family History: Family History  Problem Relation Age of Onset  . Healthy Mother   . Healthy Father   . Cancer Neg Hx     Review of Systems: Review of Systems  Constitutional: Negative for chills and fever.  HENT: Negative for hearing loss.   Eyes: Negative for blurred vision.  Respiratory: Negative for shortness of breath.   Cardiovascular: Negative for chest pain.   Gastrointestinal: Positive for abdominal pain, constipation, nausea and vomiting.  Genitourinary: Negative for dysuria.  Musculoskeletal: Negative for myalgias.  Skin: Negative for rash.  Neurological: Negative for dizziness.  Psychiatric/Behavioral: Negative for depression.  All other systems reviewed and are negative.   Physical Exam BP 137/85   Pulse 96   Temp (!) 97.5 F (36.4 C) (Oral)   Ht 5' 2" (1.575 m)   Wt 90.3 kg (199 lb)   BMI 36.40 kg/m  CONSTITUTIONAL: no acute distress HEENT:  Normocephalic, atraumatic, extraocular motion intact. NECK: Trachea is midline, and there is no jugular venous distension.  RESPIRATORY:  Lungs are clear, and breath sounds are equal bilaterally. Normal respiratory effort without pathologic use of accessory muscles. CARDIOVASCULAR: Heart is regular without murmurs, gallops, or rubs. GI: The abdomen is soft, obese, nondistended, with mild discomfort to palpation in the right upper quadrant. Negative Murphy sign. MUSCULOSKELETAL:  Normal muscle strength and tone in all four extremities.  No peripheral edema or cyanosis. SKIN: Skin turgor is normal. There are no pathologic skin lesions.  NEUROLOGIC:  Motor and sensation is grossly normal.  Cranial nerves are grossly intact. PSYCH:  Alert and oriented to person, place and time. Affect is normal.  Laboratory Analysis: Labs from 8/13 show WBC of 9.0, hematocrit 40.7, sodium 137, potassium 3.7, chloride 106, BUN 8, creatinine 0.68, glucose 133, normal LFTs with exception of AST mildly elevated at 44  Imaging: Ultrasound on 8/13: IMPRESSION: 1. Multiple shadowing   stones in the gallbladder, no significant wall thickening, negative sonographic Murphy. Common duct diameter upper normal at 6.6 mm 2. Hepatic steatosis 3. Poor visualization of pancreas and distal aorta due to bowel gas  Assessment and Plan: This is a 36 y.o. female who presents with long-standing history of biliary colic which has now  been more frequent episodes.  Discussed with the patient a role for surgical intervention with her biliary colic. Given that conservative measures with trying to change her eating habits has not helped and now if anything her symptoms are progressing and happening with any oral intake, would recommend surgical management consisting of laparoscopic cholecystectomy. Described the surgery in detail to the patient with the risk of potential bleeding, infection, injury to surrounding structures, risk of having to convert to an open procedure as well. She is willing to proceed. Discussed with the patient and also some of the postoperative outcomes and expectations. The patient does understand that this is an outpatient procedure. She is willing to proceed with surgery and will be scheduled for 9/25.  Face-to-face time spent with the patient and care providers was 45 minutes, with more than 50% of the time spent counseling, educating, and coordinating care of the patient.     Howie IllJose Luis Elim Economou, MD [facility]

## 2017-09-10 NOTE — Patient Instructions (Addendum)
Su cirugia va a ser el 25 de [place]iembre del 2018 con el Dr. Henrene DodgeJose Piscoya en el [facility] del segundo piso (Same Day Surgery). Un interprete va a estar alli para usted.   Tambien le Zenaida Niecevan a llamar con Neomia Dearuna fecha para que el Anestesiologo la conozca y hacerle examenes antes de su [place].  El dia anterior, necesita llamar al numero de telefono previsto: [phone].    Si tiene preguntas antes de Girtha Hakesu cirugia, llameme al [phone] Kandis Cocking(Genavive Kubicki)   Colecistectoma laparoscpica, cuidados posteriores (Laparoscopic Cholecystectomy, Care After) Estas indicaciones le proporcionan informacin acerca de cmo deber cuidarse despus del procedimiento. El mdico tambin podr darle instrucciones especficas. Comunquese con el mdico si tiene algn problema o tiene preguntas despus del procedimiento. CUIDADOS EN EL HOGAR Cuidado de la incisin  Siga las indicaciones del mdico en lo que respecta al cuidado de los cortes de la ciruga (incisiones). Haga lo siguiente: ? Lvese las manos con agua y jabn antes de Multimedia programmercambiar las vendas (vendaje). Use un desinfectante para manos si no dispone de [place] y [place]jabn. ? Cambie el vendaje como se lo haya indicado el mdico. ? No retire los puntos (suturas), el QUALCOMMadhesivo para la piel o las tiras [place] Country Villageadhesivas. Tal vez deban dejarse puestos en la piel durante 2semanas o ms tiempo. Si las tiras Chenoaadhesivas se despegan y se enroscan, puede recortar los bordes sueltos. No retire las tiras Agilent Technologiesadhesivas por completo a menos que el mdico lo autorice.  No tome baos de inmersin, no practique natacin ni use el jacuzzi hasta que el mdico lo autorice. Pregntele al mdico si puede ducharse. Delle Reiningal vez solo le permitan darse baos de Commerceesponja. Instrucciones generales  Baxter Internationalome los medicamentos de venta libre y los recetados solamente como se lo haya indicado el mdico.  No conduzca ni use maquinaria pesada mientras toma analgsicos recetados.  Reanude la dieta normal como se lo haya indicado  el mdico.  No levante ningn objeto que pese ms de 10libras (4,5kg).  No practique deportes de contacto durante 1semana o hasta que el mdico lo autorice. SOLICITE AYUDA SI:  Tiene enrojecimiento, hinchazn o Art therapistdolor en el lugar de los cortes quirrgicos.  Tiene secrecin de lquido, sangre o pus que Micron Technologyemana de los cortes.  Percibe que sale mal olor de la zona de las incisiones.  Los cortes quirrgicos se abren.  Tiene fiebre.  SOLICITE AYUDA DE INMEDIATO SI:  Tiene una erupcin cutnea.  Tiene dificultad para respirar.  Siente dolor en el pecho.  Siente que Chief Technology Officerel dolor en los hombros (en la zona donde van los breteles) Harrington Challengerempeora.  Se desvanece (se desmaya) o se marea mientras est de pie.  Tiene dolor muy intenso de vientre (abdomen).  Tiene malestar estomacal (nuseas) o vomita durante ms de 1da.  Esta informacin no tiene Theme park managercomo fin reemplazar el consejo del mdico. Asegrese de hacerle al mdico cualquier pregunta que tenga. Document Released: 08/27/2011 Document Revised: 09/05/2015 Document Reviewed: 06/02/2016 Elsevier Interactive Patient Education  2017 [employer]

## 2017-09-14 ENCOUNTER — Telehealth: Payer: Self-pay

## 2017-09-14 NOTE — Telephone Encounter (Signed)
The patient has been advised of the Pre Op date and time and the Surgery date with [place] with [facility].  Surgery: 09/22/17- Dr Aleen Campi  - Laparoscopic Chole  Pre Op: 09/16/17 at 11:15 AM, Office.  Patient made aware to call [phone], between 1-3 PM the day before surgery to find out what time to arrive.

## 2017-09-16 ENCOUNTER — Encounter
Admission: RE | Admit: 2017-09-16 | Discharge: 2017-09-16 | Disposition: A | Discharge status: Home / Self Care | Payer: Self-pay | Source: Ambulatory Visit | Attending: Surgery | Admitting: Surgery

## 2017-09-16 DIAGNOSIS — Z01818 Encounter for other preprocedural examination: Secondary | ICD-10-CM | POA: Insufficient documentation

## 2017-09-16 DIAGNOSIS — K8051 Calculus of bile duct without cholangitis or cholecystitis with obstruction: Secondary | ICD-10-CM | POA: Insufficient documentation

## 2017-09-16 HISTORY — DX: Gastro-esophageal reflux disease without esophagitis: K21.9

## 2017-09-16 HISTORY — DX: Anemia, unspecified: D64.9

## 2017-09-16 NOTE — Patient Instructions (Signed)
Your procedure is scheduled on: Tuesday 09/22/17 Su procedimiento est programado para: Report to Day Surgery.  Presntese a: To find out your arrival time please call [phone] between 1PM - 3PM on Monday 09/21/17. Para saber su hora de llegada por favor llame al [phone] entre la 1PM - 3PM el da:  Remember: Instructions that are not followed completely may result in serious medical risk, up to and including death, or upon the discretion of your surgeon and anesthesiologist your surgery may need to be rescheduled.  Recuerde: Las instrucciones que no se siguen completamente Armed forces logistics/support/administrative officer en un riesgo de salud grave, incluyendo hasta la [place] o a discrecin de su cirujano y Scientific laboratory technician, su ciruga se puede posponer.   __X__ 1. Do not eat food  after midnight. No gum chewing or hard candies.  No coma alimentos   despus de la medianoche.  No mastique chicle ni caramelos  Duros. 2 horas antes de llegar puede tomar liquidos claros ( Gatorade, Te)     __X__ 2. No alcohol for 24 hours before or after surgery.    No tome alcohol durante las 24 horas antes ni despus de la [place].   ____ 3. Bring all medications with you on the day of surgery if instructed.    Lleve todos los medicamentos con usted el da de su ciruga si se le ha indicado as.   __X__ 4. Notify your doctor if there is any change in your medical condition (cold, fever,                             infections).    Informe a su mdico si hay algn cambio en su condicin mdica (resfriado, fiebre, infecciones).   Do not wear jewelry, make-up, hairpins, clips or nail polish.  No use joyas, maquillajes, pinzas/ganchos para el cabello ni esmalte de uas.  Do not wear lotions, powders, or perfumes. .  No use lociones, polvos o perfumes.  .    Do not shave 48 hours prior to surgery. Men may shave face and neck.  No se afeite 48 horas antes de la [place].  Los hombres pueden [employer] cara y el cuello.   Do not bring  valuables to the hospital.   No lleve objetos de valor al hospital.  [facility] is not responsible for any belongings or valuables.  [place] no se hace responsable de ningn tipo de pertenencias u objetos de Licensed conveyancer.               Contacts, dentures or bridgework may not be worn into surgery.  Los lentes de [place], las dentaduras postizas o puentes no se pueden usar en la [place].  Leave your suitcase in the car. After surgery it may be brought to your room.  Deje su maleta en el auto.  Despus de la ciruga podr traerla a su habitacin.  For patients admitted to the hospital, discharge time is determined by your treatment team.  Para los pacientes que sean ingresados al hospital, el tiempo en el cual se le dar de alta es determinado por su                equipo de [place].   Patients discharged the day of surgery will not be allowed to drive home. A los pacientes que se les da de alta el mismo da de la ciruga no se les permitir conducir a Higher education careers adviser.   Please read over  the following fact sheets that you were given: Por favor lea las siguientes hojas de informacin que le dieron:   MRSA Information   ____ Take these medicines the morning of surgery with A SIP OF WATER:          Tome estas medicinas la maana de la ciruga con UN SORBO DE AGUA:  1. none  2.   3.   4.       5.  6.  ____ Fleet Enema (as directed)          Enema de Fleet (segn lo indicado)    __x__ Use CHG Soap as directed          Utilice el jabn de CHG segn lo indicado  ____ Use inhalers on the day of surgery          Use los inhaladores el da de la ciruga  ____ Stop metformin 2 days prior to surgery          Deje de tomar el metformin 2 das antes de la ciruga    ____ Take 1/2 of usual insulin dose the night before surgery and none on the morning of surgery           Tome la mitad de la dosis habitual de insulina la noche antes de la [place] y no tome nada en la maana de la              ciruga  ____ Stop Coumadin/Plavix/aspirin on           Deje de tomar el Coumadin/Plavix/aspirina el da:  ____ Stop Anti-inflammatories on           Deje de tomar antiinflamatorios el da:   ____ Stop supplements until after surgery            Deje de tomar suplementos hasta despus de la ciruga  ____ Bring C-Pap to the hospital          Lleve el C-Pap al hospital

## 2017-09-22 ENCOUNTER — Encounter: Payer: Self-pay | Admitting: Anesthesiology

## 2017-09-22 ENCOUNTER — Ambulatory Visit: Payer: Self-pay | Admitting: Anesthesiology

## 2017-09-22 ENCOUNTER — Encounter
Admission: RE | Disposition: A | Discharge status: Home / Self Care | Payer: Self-pay | Source: Ambulatory Visit | Attending: Surgery

## 2017-09-22 ENCOUNTER — Ambulatory Visit
Admission: RE | Admit: 2017-09-22 | Discharge: 2017-09-22 | Disposition: A | Discharge status: Home / Self Care | Payer: No Typology Code available for payment source | Source: Ambulatory Visit | Attending: Surgery | Admitting: Surgery

## 2017-09-22 DIAGNOSIS — K219 Gastro-esophageal reflux disease without esophagitis: Secondary | ICD-10-CM | POA: Insufficient documentation

## 2017-09-22 DIAGNOSIS — Z6836 Body mass index (BMI) 36.0-36.9, adult: Secondary | ICD-10-CM | POA: Insufficient documentation

## 2017-09-22 DIAGNOSIS — K8020 Calculus of gallbladder without cholecystitis without obstruction: Secondary | ICD-10-CM

## 2017-09-22 DIAGNOSIS — K760 Fatty (change of) liver, not elsewhere classified: Secondary | ICD-10-CM | POA: Insufficient documentation

## 2017-09-22 DIAGNOSIS — Z79899 Other long term (current) drug therapy: Secondary | ICD-10-CM | POA: Insufficient documentation

## 2017-09-22 DIAGNOSIS — E669 Obesity, unspecified: Secondary | ICD-10-CM | POA: Insufficient documentation

## 2017-09-22 HISTORY — PX: CHOLECYSTECTOMY: SHX55

## 2017-09-22 LAB — POCT PREGNANCY, URINE: PREG TEST UR: NEGATIVE

## 2017-09-22 SURGERY — LAPAROSCOPIC CHOLECYSTECTOMY
Anesthesia: General | Site: Abdomen | Wound class: Clean Contaminated

## 2017-09-22 MED ORDER — CHLORHEXIDINE GLUCONATE CLOTH 2 % EX PADS: 6.0000 | MEDICATED_PAD | Freq: Once | CUTANEOUS | Status: DC

## 2017-09-22 MED ORDER — DEXAMETHASONE SODIUM PHOSPHATE 10 MG/ML IJ SOLN
INTRAMUSCULAR | Status: DC | PRN
  Administered 2017-09-22: 10 mg via INTRAVENOUS

## 2017-09-22 MED ORDER — FENTANYL CITRATE (PF) 100 MCG/2ML IJ SOLN
INTRAMUSCULAR | Status: AC
  Administered 2017-09-22: 25 ug via INTRAVENOUS
  Filled 2017-09-22: qty 2

## 2017-09-22 MED ORDER — FAMOTIDINE 20 MG PO TABS
20.0000 mg | ORAL_TABLET | Freq: Once | ORAL | Status: AC
  Administered 2017-09-22: 20 mg via ORAL

## 2017-09-22 MED ORDER — MIDAZOLAM HCL 2 MG/2ML IJ SOLN
INTRAMUSCULAR | Status: AC
  Filled 2017-09-22: qty 2

## 2017-09-22 MED ORDER — ROCURONIUM BROMIDE 100 MG/10ML IV SOLN
INTRAVENOUS | Status: DC | PRN
  Administered 2017-09-22: 30 mg via INTRAVENOUS
  Administered 2017-09-22: 20 mg via INTRAVENOUS

## 2017-09-22 MED ORDER — KETOROLAC TROMETHAMINE 30 MG/ML IJ SOLN
INTRAMUSCULAR | Status: AC
  Filled 2017-09-22: qty 1

## 2017-09-22 MED ORDER — PROMETHAZINE HCL 25 MG/ML IJ SOLN
6.2500 mg | Freq: Once | INTRAMUSCULAR | Status: AC
  Administered 2017-09-22: 6.25 mg via INTRAVENOUS

## 2017-09-22 MED ORDER — LIDOCAINE HCL (CARDIAC) 20 MG/ML IV SOLN
INTRAVENOUS | Status: DC | PRN
  Administered 2017-09-22: 50 mg via INTRAVENOUS

## 2017-09-22 MED ORDER — FENTANYL CITRATE (PF) 100 MCG/2ML IJ SOLN
INTRAMUSCULAR | Status: AC
  Filled 2017-09-22: qty 2

## 2017-09-22 MED ORDER — BUPIVACAINE-EPINEPHRINE (PF) 0.25% -1:200000 IJ SOLN
INTRAMUSCULAR | Status: AC
  Filled 2017-09-22: qty 30

## 2017-09-22 MED ORDER — FENTANYL CITRATE (PF) 100 MCG/2ML IJ SOLN
INTRAMUSCULAR | Status: DC | PRN
  Administered 2017-09-22 (×2): 50 ug via INTRAVENOUS
  Administered 2017-09-22: 100 ug via INTRAVENOUS

## 2017-09-22 MED ORDER — LACTATED RINGERS IV SOLN
INTRAVENOUS | Status: DC
  Administered 2017-09-22: 13:00:00 via INTRAVENOUS

## 2017-09-22 MED ORDER — ONDANSETRON HCL 4 MG/2ML IJ SOLN
4.0000 mg | Freq: Once | INTRAMUSCULAR | Status: AC | PRN
  Administered 2017-09-22: 4 mg via INTRAVENOUS

## 2017-09-22 MED ORDER — PROPOFOL 10 MG/ML IV BOLUS
INTRAVENOUS | Status: DC | PRN
  Administered 2017-09-22: 150 mg via INTRAVENOUS

## 2017-09-22 MED ORDER — CEFAZOLIN SODIUM-DEXTROSE 2-4 GM/100ML-% IV SOLN
2.0000 g | INTRAVENOUS | Status: AC
  Administered 2017-09-22: 2 g via INTRAVENOUS

## 2017-09-22 MED ORDER — BUPIVACAINE-EPINEPHRINE (PF) 0.25% -1:200000 IJ SOLN
INTRAMUSCULAR | Status: DC | PRN
  Administered 2017-09-22: 30 mL via PERINEURAL

## 2017-09-22 MED ORDER — ONDANSETRON HCL 4 MG/2ML IJ SOLN
INTRAMUSCULAR | Status: DC | PRN
  Administered 2017-09-22: 4 mg via INTRAVENOUS

## 2017-09-22 MED ORDER — ACETAMINOPHEN 500 MG PO TABS
1000.0000 mg | ORAL_TABLET | ORAL | Status: AC
  Administered 2017-09-22: 1000 mg via ORAL

## 2017-09-22 MED ORDER — OXYCODONE HCL 5 MG PO TABS: 5.0000 mg | ORAL_TABLET | Freq: Four times a day (QID) | ORAL | 0 refills | Status: DC | PRN

## 2017-09-22 MED ORDER — CHLORHEXIDINE GLUCONATE CLOTH 2 % EX PADS
6.0000 | MEDICATED_PAD | Freq: Once | CUTANEOUS | Status: AC
  Administered 2017-09-22: 6 via TOPICAL

## 2017-09-22 MED ORDER — ACETAMINOPHEN 500 MG PO TABS
ORAL_TABLET | ORAL | Status: AC
  Filled 2017-09-22: qty 2

## 2017-09-22 MED ORDER — SUGAMMADEX SODIUM 200 MG/2ML IV SOLN
INTRAVENOUS | Status: DC | PRN
  Administered 2017-09-22: 180 mg via INTRAVENOUS

## 2017-09-22 MED ORDER — DEXAMETHASONE SODIUM PHOSPHATE 10 MG/ML IJ SOLN
INTRAMUSCULAR | Status: AC
  Filled 2017-09-22: qty 1

## 2017-09-22 MED ORDER — GABAPENTIN 300 MG PO CAPS
ORAL_CAPSULE | ORAL | Status: AC
  Filled 2017-09-22: qty 1

## 2017-09-22 MED ORDER — PROMETHAZINE HCL 25 MG/ML IJ SOLN
INTRAMUSCULAR | Status: AC
  Administered 2017-09-22: 6.25 mg via INTRAVENOUS
  Filled 2017-09-22: qty 1

## 2017-09-22 MED ORDER — FAMOTIDINE 20 MG PO TABS
ORAL_TABLET | ORAL | Status: DC
Start: 2017-09-22 — End: 2017-09-22
  Filled 2017-09-22: qty 1

## 2017-09-22 MED ORDER — SODIUM CHLORIDE 0.9 % IJ SOLN
INTRAMUSCULAR | Status: AC
  Filled 2017-09-22: qty 10

## 2017-09-22 MED ORDER — ONDANSETRON HCL 4 MG/2ML IJ SOLN
INTRAMUSCULAR | Status: AC
  Filled 2017-09-22: qty 2

## 2017-09-22 MED ORDER — FENTANYL CITRATE (PF) 100 MCG/2ML IJ SOLN
25.0000 ug | INTRAMUSCULAR | Status: DC | PRN
  Administered 2017-09-22 (×4): 25 ug via INTRAVENOUS

## 2017-09-22 MED ORDER — SUGAMMADEX SODIUM 200 MG/2ML IV SOLN
INTRAVENOUS | Status: AC
  Filled 2017-09-22: qty 2

## 2017-09-22 MED ORDER — MIDAZOLAM HCL 2 MG/2ML IJ SOLN
INTRAMUSCULAR | Status: DC | PRN
  Administered 2017-09-22: 2 mg via INTRAVENOUS

## 2017-09-22 MED ORDER — CEFAZOLIN SODIUM-DEXTROSE 2-4 GM/100ML-% IV SOLN
INTRAVENOUS | Status: AC
  Filled 2017-09-22: qty 100

## 2017-09-22 MED ORDER — GABAPENTIN 300 MG PO CAPS
300.0000 mg | ORAL_CAPSULE | ORAL | Status: AC
  Administered 2017-09-22: 300 mg via ORAL

## 2017-09-22 SURGICAL SUPPLY — 44 items
APPLIER CLIP 5 13 M/L LIGAMAX5 (MISCELLANEOUS) ×3
BLADE SURG 15 STRL LF DISP TIS (BLADE) ×1 IMPLANT
BLADE SURG 15 STRL SS (BLADE) ×2
CANISTER SUCT 1200ML W/VALVE (MISCELLANEOUS) ×3 IMPLANT
CATH CHOLANGI 4FR 420404F (CATHETERS) IMPLANT
CHLORAPREP W/TINT 26ML (MISCELLANEOUS) ×3 IMPLANT
CLIP APPLIE 5 13 M/L LIGAMAX5 (MISCELLANEOUS) ×1 IMPLANT
CONRAY 60ML FOR OR (MISCELLANEOUS) IMPLANT
DERMABOND ADVANCED (GAUZE/BANDAGES/DRESSINGS) ×2
DERMABOND ADVANCED .7 DNX12 (GAUZE/BANDAGES/DRESSINGS) ×1 IMPLANT
DRAPE C-ARM XRAY 36X54 (DRAPES) IMPLANT
ELECT REM PT RETURN 9FT ADLT (ELECTROSURGICAL) ×3
ELECTRODE REM PT RTRN 9FT ADLT (ELECTROSURGICAL) ×1 IMPLANT
FILTER LAP SMOKE EVAC STRL (MISCELLANEOUS) ×3 IMPLANT
GLOVE BIO SURGEON STRL SZ7 (GLOVE) ×3 IMPLANT
GLOVE SURG SYN 7.0 (GLOVE) ×3 IMPLANT
GLOVE SURG SYN 7.5  E (GLOVE) ×2
GLOVE SURG SYN 7.5 E (GLOVE) ×1 IMPLANT
GOWN STRL REUS W/ TWL LRG LVL3 (GOWN DISPOSABLE) ×3 IMPLANT
GOWN STRL REUS W/TWL LRG LVL3 (GOWN DISPOSABLE) ×6
IRRIGATION STRYKERFLOW (MISCELLANEOUS) ×1 IMPLANT
IRRIGATOR STRYKERFLOW (MISCELLANEOUS) ×3
IV CATH ANGIO 12GX3 LT BLUE (NEEDLE) IMPLANT
IV NS 1000ML (IV SOLUTION) ×2
IV NS 1000ML BAXH (IV SOLUTION) ×1 IMPLANT
JACKSON PRATT 10 (INSTRUMENTS) IMPLANT
L-HOOK LAP DISP 36CM (ELECTROSURGICAL) ×3
LABEL OR SOLS (LABEL) ×3 IMPLANT
LHOOK LAP DISP 36CM (ELECTROSURGICAL) ×1 IMPLANT
NDL SAFETY 22GX1.5 (NEEDLE) ×3 IMPLANT
NEEDLE HYPO 22GX1.5 SAFETY (NEEDLE) ×3 IMPLANT
PACK LAP CHOLECYSTECTOMY (MISCELLANEOUS) ×3 IMPLANT
PENCIL ELECTRO HAND CTR (MISCELLANEOUS) ×3 IMPLANT
POUCH SPECIMEN RETRIEVAL 10MM (ENDOMECHANICALS) ×3 IMPLANT
SCISSORS METZENBAUM CVD 33 (INSTRUMENTS) ×3 IMPLANT
SLEEVE ADV FIXATION 5X100MM (TROCAR) ×9 IMPLANT
SPONGE VERSALON 4X4 4PLY (MISCELLANEOUS) IMPLANT
SUT MNCRL 4-0 (SUTURE) ×2
SUT MNCRL 4-0 27XMFL (SUTURE) ×1
SUT VICRYL 0 AB UR-6 (SUTURE) ×3 IMPLANT
SUTURE MNCRL 4-0 27XMF (SUTURE) ×1 IMPLANT
TROCAR BALLN GELPORT 12X130M (ENDOMECHANICALS) ×3 IMPLANT
TROCAR Z-THREAD OPTICAL 5X100M (TROCAR) ×3 IMPLANT
TUBING INSUFFLATOR HI FLOW (MISCELLANEOUS) ×3 IMPLANT

## 2017-09-22 NOTE — Discharge Instructions (Signed)
°  1.  Despues de la operacion, los ninos pueden aparentar como si tuvieran un poco de [place]; su cara puede estar roja y su piel puede sentirse caliente.  La medicina administrada antes de la operacion es usualmente la causa de esto.    2.  Los medicamentos usados en la cirugia de hoy pueden hacer que su nino este sonoliento por el resto del dia.  Sin embargo, muchos ninos se sienten listos para reanudar sus actividades normales, dentro de [place] horas.    3. Por favor anime a su nino a que tome muchos liquidos hoy.  Poco a poco puede reanudar su alimentacion normal, segun el nino lo tolere.     4.  Por favor llame a su doctor inmediatamente, si su nino tiene un sangrado raro o inusual, problemas al respirar, [place], o si el dolor no se alivia con la [place].    5.  Instrucciones especificas:    AMBULATORY SURGERY  DISCHARGE INSTRUCTIONS   1) The drugs that you were given will stay in your system until tomorrow so for the next 24 hours you should not:  A) Drive an automobile B) Make any legal decisions C) Drink any alcoholic beverage   2) You may resume regular meals tomorrow.  Today it is better to start with liquids and gradually work up to solid foods.  You may eat anything you prefer, but it is better to start with liquids, then soup and crackers, and gradually work up to solid foods.   3) Please notify your doctor immediately if you have any unusual bleeding, trouble breathing, redness and pain at the surgery site, drainage, fever, or pain not relieved by medication. 4)   5) Your post-operative visit with Dr.                                     is: Date:                        Time:    Please call to schedule your post-operative visit.  6) Additional Instructions:

## 2017-09-22 NOTE — H&P (View-Only) (Signed)
09/10/2017  Reason for Visit:  Biliary Colic  History of Present Illness: [name] is a 36 y.o. female with a long-standing history of biliary colic.  She was recently seen in the ED on 8/13 and was referred for surgical evaluation.  She reports that more recently her pain episodes have become more frequently.  She reports that the pain happens with any by mouth intake but particularly with greasy foods or spicy foods. The pain is in the right upper quadrant and radiates to the back. The pain episodes don't last more than an hour. Reports having had two episodes of emesis and also does have nausea.  Denies any heartburn.  Reports some occasional constipation. Denies any fevers or chills at home. In the emergency room she was given medications for pain and nausea. The pain medication does help with some of her pain control. She reports that the nausea medication makes her sleepy.  Past Medical History: Past Medical History:  Diagnosis Date  . Amenorrhea   . Biliary colic      Past Surgical History: Past Surgical History:  Procedure Laterality Date  . NO PAST SURGERIES      Home Medications: Prior to Admission medications   Medication Sig Start Date End Date Taking? Authorizing Provider  acetaminophen (TYLENOL) 500 MG tablet Take 500 mg by mouth every 6 (six) hours as needed.   Yes [provider]    Allergies: No Known Allergies  Social History:  reports that she has never smoked. She has never used smokeless tobacco. She reports that she does not drink alcohol or use drugs.   Family History: Family History  Problem Relation Age of Onset  . Healthy Mother   . Healthy Father   . Cancer Neg Hx     Review of Systems: Review of Systems  Constitutional: Negative for chills and fever.  HENT: Negative for hearing loss.   Eyes: Negative for blurred vision.  Respiratory: Negative for shortness of breath.   Cardiovascular: Negative for chest pain.   Gastrointestinal: Positive for abdominal pain, constipation, nausea and vomiting.  Genitourinary: Negative for dysuria.  Musculoskeletal: Negative for myalgias.  Skin: Negative for rash.  Neurological: Negative for dizziness.  Psychiatric/Behavioral: Negative for depression.  All other systems reviewed and are negative.   Physical Exam BP 137/85   Pulse 96   Temp (!) 97.5 F (36.4 C) (Oral)   Ht  (1.575 m)   Wt 90.3 kg (199 lb)   BMI 36.40 kg/m  CONSTITUTIONAL: no acute distress HEENT:  Normocephalic, atraumatic, extraocular motion intact. NECK: Trachea is midline, and there is no jugular venous distension.  RESPIRATORY:  Lungs are clear, and breath sounds are equal bilaterally. Normal respiratory effort without pathologic use of accessory muscles. CARDIOVASCULAR: Heart is regular without murmurs, gallops, or rubs. GI: The abdomen is soft, obese, nondistended, with mild discomfort to palpation in the right upper quadrant. Negative Murphy sign. MUSCULOSKELETAL:  Normal muscle strength and tone in all four extremities.  No peripheral edema or cyanosis. SKIN: Skin turgor is normal. There are no pathologic skin lesions.  NEUROLOGIC:  Motor and sensation is grossly normal.  Cranial nerves are grossly intact. PSYCH:  Alert and oriented to person, place and time. Affect is normal.  Laboratory Analysis: Labs from 8/13 show WBC of 9.0, hematocrit 40.7, sodium 137, potassium 3.7, chloride 106, BUN 8, creatinine 0.68, glucose 133, normal LFTs with exception of AST mildly elevated at 44  Imaging: Ultrasound on 8/13: IMPRESSION: 1. Multiple shadowing  stones in the gallbladder, no significant wall thickening, negative sonographic Murphy. Common duct diameter upper normal at 6.6 mm 2. Hepatic steatosis 3. Poor visualization of pancreas and distal aorta due to bowel gas  Assessment and Plan: This is a 36 y.o. female who presents with long-standing history of biliary colic which has now  been more frequent episodes.  Discussed with the patient a role for surgical intervention with her biliary colic. Given that conservative measures with trying to change her eating habits has not helped and now if anything her symptoms are progressing and happening with any oral intake, would recommend surgical management consisting of laparoscopic cholecystectomy. Described the surgery in detail to the patient with the risk of potential bleeding, infection, injury to surrounding structures, risk of having to convert to an open procedure as well. She is willing to proceed. Discussed with the patient and also some of the postoperative outcomes and expectations. The patient does understand that this is an outpatient procedure. She is willing to proceed with surgery and will be scheduled for 9/25.  Face-to-face time spent with the patient and care providers was 45 minutes, with more than 50% of the time spent counseling, educating, and coordinating care of the patient.     Howie IllJose Luis Jaleesa Cervi, MD [facility]

## 2017-09-22 NOTE — Anesthesia Preprocedure Evaluation (Signed)
Anesthesia Evaluation  Patient identified by MRN, date of birth, ID band Patient awake    Reviewed: Allergy & Precautions, NPO status , Patient's Chart, lab work & pertinent test results, reviewed documented beta blocker date and time   Airway Mallampati: II  TM Distance: >3 FB     Dental  (+) Chipped   Pulmonary           Cardiovascular      Neuro/Psych    GI/Hepatic GERD  Controlled,  Endo/Other    Renal/GU      Musculoskeletal   Abdominal   Peds  Hematology  (+) anemia ,   Anesthesia Other Findings Obese.  Reproductive/Obstetrics                             Anesthesia Physical Anesthesia Plan  ASA: II  Anesthesia Plan: General   Post-op Pain Management:    Induction: Intravenous  PONV Risk Score and Plan:   Airway Management Planned: Oral ETT  Additional Equipment:   Intra-op Plan:   Post-operative Plan:   Informed Consent: I have reviewed the patients History and Physical, chart, labs and discussed the procedure including the risks, benefits and alternatives for the proposed anesthesia with the patient or authorized representative who has indicated his/her understanding and acceptance.     Plan Discussed with: CRNA  Anesthesia Plan Comments:         Anesthesia Quick Evaluation

## 2017-09-22 NOTE — Interval H&P Note (Signed)
History and Physical Interval Note:  09/22/2017 2:44 PM  [name]  has presented today for surgery, with the diagnosis of Biliary colic  The various methods of treatment have been discussed with the patient and family. After consideration of risks, benefits and other options for treatment, the patient has consented to  Procedure(s): LAPAROSCOPIC CHOLECYSTECTOMY (N/A) as a surgical intervention .  The patient's history has been reviewed, patient examined, no change in status, stable for surgery.  I have reviewed the patient's chart and labs.  Questions were answered to the patient's satisfaction.     Dewaun Kinzler

## 2017-09-22 NOTE — Anesthesia Post-op Follow-up Note (Signed)
Anesthesia QCDR form completed.        

## 2017-09-22 NOTE — Transfer of Care (Signed)
Immediate Anesthesia Transfer of Care Note  Patient: [name]  Procedure(s) Performed: Procedure(s): LAPAROSCOPIC CHOLECYSTECTOMY (N/A)  Patient Location: PACU  Anesthesia Type:General  Level of Consciousness: drowsy and patient cooperative  Airway & Oxygen Therapy: Patient Spontanous Breathing and Patient connected to face mask oxygen  Post-op Assessment: Report given to RN and Post -op Vital signs reviewed and stable  Post vital signs: Reviewed and stable  Last Vitals:  Vitals:   09/22/17 1301 09/22/17 1654  BP: 135/81 115/66  Pulse: 89   Resp: 14   Temp: 36.8 C 36.7 C  SpO2: 99%     Last Pain:  Vitals:   09/22/17 1301  TempSrc: Oral  PainSc: 0-No pain         Complications: No apparent anesthesia complications

## 2017-09-22 NOTE — Anesthesia Postprocedure Evaluation (Signed)
Anesthesia Post Note  Patient: [name]  Procedure(s) Performed: Procedure(s) (LRB): LAPAROSCOPIC CHOLECYSTECTOMY (N/A)  Patient location during evaluation: PACU Anesthesia Type: General Level of consciousness: awake and alert and oriented Pain management: pain level controlled Vital Signs Assessment: post-procedure vital signs reviewed and stable Respiratory status: spontaneous breathing Cardiovascular status: blood pressure returned to baseline Anesthetic complications: no     Last Vitals:  Vitals:   09/22/17 1748 09/22/17 1843  BP: (!) 121/59 105/61  Pulse: 95 90  Resp: 18 16  Temp: 36.7 C (!) 36.3 C  SpO2: 93% 97%    Last Pain:  Vitals:   09/22/17 1843  TempSrc: Oral  PainSc: 3                  Ramsay Bognar

## 2017-09-22 NOTE — Anesthesia Procedure Notes (Signed)
Procedure Name: Intubation Date/Time: 09/22/2017 3:21 PM Performed by: Jonna Clark Pre-anesthesia Checklist: Patient identified, Patient being monitored, Timeout performed, Emergency Drugs available and Suction available Patient Re-evaluated:Patient Re-evaluated prior to induction Oxygen Delivery Method: Circle system utilized Preoxygenation: Pre-oxygenation with 100% oxygen Induction Type: IV induction Ventilation: Mask ventilation without difficulty Laryngoscope Size: Mac and 3 Grade View: Grade I Tube type: Oral Tube size: 7.0 mm Number of attempts: 1 Airway Equipment and Method: Stylet Placement Confirmation: ETT inserted through vocal cords under direct vision,  positive ETCO2 and breath sounds checked- equal and bilateral Secured at: 21 cm Tube secured with: Tape Dental Injury: Teeth and Oropharynx as per pre-operative assessment

## 2017-09-22 NOTE — Op Note (Signed)
  Procedure Date:  09/22/2017  Pre-operative Diagnosis:  Symptomatic cholelithiasis  Post-operative Diagnosis:  Symptomatic cholelithiasis  Procedure:  Laparoscopic cholecystectomy  Surgeon:  Howie Ill, MD  Anesthesia:  General endotracheal  Estimated Blood Loss:  20 ml  Specimens:  gallbladder  Complications:  None  Indications for Procedure:  This is a 36 y.o. female who presents with abdominal pain and workup revealing symptomatic cholelithiasis.  The benefits, complications, treatment options, and expected outcomes were discussed with the patient. The risks of bleeding, infection, recurrence of symptoms, failure to resolve symptoms, bile duct damage, bile duct leak, retained common bile duct stone, bowel injury, and need for further procedures were all discussed with the patient and she was willing to proceed.  Description of Procedure: The patient was correctly identified in the preoperative area and brought into the operating room.  The patient was placed supine with VTE prophylaxis in place.  Appropriate time-outs were performed.  Anesthesia was induced and the patient was intubated.  Appropriate antibiotics were infused.  The abdomen was prepped and draped in a sterile fashion. An infraumbilical incision was made. A cutdown technique was used to enter the abdominal cavity without injury, and a Hasson trocar was inserted.  Pneumoperitoneum was obtained with appropriate opening pressures.  A 5-mm port was placed in the subxiphoid area and two 5-mm ports were placed in the right upper quadrant under direct visualization.  The gallbladder was identified.  There were some adhesions from the omentum to the right upper quadrant that were taken down using cautery. The gallbladder was decompressed with needle and suction.  The fundus was grasped and retracted cephalad.  Adhesions were lysed bluntly and with electrocautery. The infundibulum was grasped and retracted laterally, exposing  the peritoneum overlying the gallbladder.  This was incised with electrocautery and extended on either side of the gallbladder.  The cystic duct and cystic artery were clearly identified and bluntly dissected.  Both were clipped twice proximally and once distally, cutting in between.  There was mild bleeding from a posterior branch that was controlled with clips and cautery.  The gallbladder was taken from the gallbladder fossa in a retrograde fashion with electrocautery. The gallbladder was placed in an Endocatch bag and brought out via the umbilical incision. The liver bed was inspected and any bleeding was controlled with electrocautery. The right upper quadrant was then inspected again revealing intact clips, no bleeding, and no ductal injury.  The area was thoroughly irrigated.  The 5 mm ports were removed under direct visualization and the Hasson trocar was removed.  The fascial opening was closed using 0 vicryl suture.  Local anesthetic was infused in all incisions and the incisions were closed with 4-0 Monocryl.  The wounds were cleaned and sealed with DermaBond.  The patient was emerged from anesthesia and extubated and brought to the recovery room for further management.  The patient tolerated the procedure well and all counts were correct at the end of the case.   Howie Ill, MD

## 2017-09-23 ENCOUNTER — Telehealth: Payer: Self-pay

## 2017-09-23 ENCOUNTER — Encounter: Payer: Self-pay | Admitting: Surgery

## 2017-09-23 NOTE — Telephone Encounter (Signed)
Post-op call made to patient at this time. Spoke with Daughter. Post-op interview questions below.  1. How are you feeling? better 2. Is your pain controlled? Some pain  3. What are you doing for the pain?narcotics  4. Are you having any Nausea or Vomiting? no  5. Are you having any Fever or Chills? no 6. Are you having any Constipation or Diarrhea? no 7. Is there any Swelling or Bruising you are concerned about? none 8. Do you have any questions or concerns at this time? none  Discussion: Appointment discussed 10/07/17 11:00am Dr.Cooper.

## 2017-09-24 LAB — SURGICAL PATHOLOGY

## 2017-10-07 ENCOUNTER — Ambulatory Visit (INDEPENDENT_AMBULATORY_CARE_PROVIDER_SITE_OTHER): Payer: No Typology Code available for payment source | Admitting: Surgery

## 2017-10-07 ENCOUNTER — Encounter: Payer: Self-pay | Admitting: Surgery

## 2017-10-07 VITALS — BP 135/86 | HR 93 | Temp 98.1°F | Ht 62.0 in | Wt 197.0 lb

## 2017-10-07 DIAGNOSIS — K8050 Calculus of bile duct without cholangitis or cholecystitis without obstruction: Secondary | ICD-10-CM

## 2017-10-07 NOTE — Progress Notes (Signed)
Outpatient postop visit  10/07/2017  [name] is an 36 y.o. female.    Procedure: Laparoscopic cholecystectomy  CC: Well   HPI: This a patient will who with the aid of an interpreter was determined that she has no problems at this time and is feeling quite well eating well no nausea vomiting fevers or chills  Medications reviewed.    Physical Exam:  BP 135/86   Pulse 93   Temp 98.1 F (36.7 C) (Oral)   Ht  (1.575 m)   Wt 197 lb (89.4 kg)   LMP 09/09/2017 (Approximate)   BMI 36.03 kg/m     PE: No icterus no jaundice abdomen soft nontender    Assessment/Plan:  Pathology is reviewed. No problems. She is doing quite well will follow up on an as-needed basis  Lattie Haw, MD, FACS

## 2017-10-07 NOTE — Patient Instructions (Signed)
GENERAL POST-OPERATIVE PATIENT INSTRUCTIONS   WOUND CARE INSTRUCTIONS:  Keep a dry clean dressing on the wound if there is drainage. The initial bandage may be removed after 24 hours.  Once the wound has quit draining you may leave it open to air.  If clothing rubs against the wound or causes irritation and the wound is not draining you may cover it with a dry dressing during the daytime.  Try to keep the wound dry and avoid ointments on the wound unless directed to do so.  If the wound becomes bright red and painful or starts to drain infected material that is not clear, please contact your physician immediately.  If the wound is mildly pink and has a thick firm ridge underneath it, this is normal, and is referred to as a healing ridge.  This will resolve over the next 4-6 weeks.  BATHING: You may shower if you have been informed of this by your surgeon. However, Please do not submerge in a tub, hot tub, or pool until incisions are completely sealed or have been told by your surgeon that you may do so.  DIET:  You may eat any foods that you can tolerate.  It is a good idea to eat a high fiber diet and take in plenty of fluids to prevent constipation.  If you do become constipated you may want to take a mild laxative or take ducolax tablets on a daily basis until your bowel habits are regular.  Constipation can be very uncomfortable, along with straining, after recent surgery.  ACTIVITY:  You are encouraged to cough and deep breath or use your incentive spirometer if you were given one, every 15-30 minutes when awake.  This will help prevent respiratory complications and low grade fevers post-operatively if you had a general anesthetic.  You may want to hug a pillow when coughing and sneezing to add additional support to the surgical area, if you had abdominal or chest surgery, which will decrease pain during these times.  You are encouraged to walk and engage in light activity for the next two weeks.  You  should not lift more than 20 pounds, until 11/03/2017 as it could put you at increased risk for complications.  Twenty pounds is roughly equivalent to a plastic bag of groceries. At that time- Listen to your body when lifting, if you have pain when lifting, stop and then try again in a few days. Soreness after doing exercises or activities of daily living is normal as you get back in to your normal routine.  MEDICATIONS:  Try to take narcotic medications and anti-inflammatory medications, such as tylenol, ibuprofen, naprosyn, etc., with food.  This will minimize stomach upset from the medication.  Should you develop nausea and vomiting from the pain medication, or develop a rash, please discontinue the medication and contact your physician.  You should not drive, make important decisions, or operate machinery when taking narcotic pain medication.  SUNBLOCK Use sun block to incision area over the next year if this area will be exposed to sun. This helps decrease scarring and will allow you avoid a permanent darkened area over your incision.  QUESTIONS:  Please feel free to call our office if you have any questions, and we will be glad to assist you. [phone]    

## 2017-10-27 ENCOUNTER — Ambulatory Visit: Payer: Self-pay

## 2017-12-04 ENCOUNTER — Ambulatory Visit: Payer: Self-pay | Attending: Internal Medicine

## 2018-01-29 ENCOUNTER — Ambulatory Visit: Payer: Self-pay | Attending: Internal Medicine

## 2018-02-12 LAB — GLUCOSE, POCT (MANUAL RESULT ENTRY): POC GLUCOSE: 97 mg/dL (ref 70–99)

## 2018-03-26 ENCOUNTER — Ambulatory Visit: Payer: Self-pay | Attending: Internal Medicine

## 2018-06-04 ENCOUNTER — Ambulatory Visit (INDEPENDENT_AMBULATORY_CARE_PROVIDER_SITE_OTHER): Payer: Self-pay | Admitting: Physician Assistant

## 2018-07-07 ENCOUNTER — Ambulatory Visit: Payer: Self-pay | Attending: Family Medicine

## 2018-08-11 ENCOUNTER — Ambulatory Visit: Payer: Self-pay | Admitting: Nurse Practitioner

## 2018-09-08 ENCOUNTER — Ambulatory Visit: Payer: Self-pay | Admitting: Nurse Practitioner

## 2018-09-29 ENCOUNTER — Ambulatory Visit: Payer: Self-pay | Attending: Nurse Practitioner | Admitting: Nurse Practitioner

## 2018-09-29 ENCOUNTER — Encounter: Payer: Self-pay | Admitting: Nurse Practitioner

## 2018-09-29 VITALS — BP 130/91 | HR 91 | Temp 98.1°F | Ht 63.0 in | Wt 202.0 lb

## 2018-09-29 DIAGNOSIS — Z9049 Acquired absence of other specified parts of digestive tract: Secondary | ICD-10-CM | POA: Insufficient documentation

## 2018-09-29 DIAGNOSIS — Z23 Encounter for immunization: Secondary | ICD-10-CM

## 2018-09-29 DIAGNOSIS — K219 Gastro-esophageal reflux disease without esophagitis: Secondary | ICD-10-CM | POA: Insufficient documentation

## 2018-09-29 DIAGNOSIS — R030 Elevated blood-pressure reading, without diagnosis of hypertension: Secondary | ICD-10-CM

## 2018-09-29 DIAGNOSIS — D649 Anemia, unspecified: Secondary | ICD-10-CM | POA: Insufficient documentation

## 2018-09-29 DIAGNOSIS — Z6835 Body mass index (BMI) 35.0-35.9, adult: Secondary | ICD-10-CM | POA: Insufficient documentation

## 2018-09-29 NOTE — Progress Notes (Signed)
Assessment & Plan:  [name] was seen today for blood pressure check.  Diagnoses and all orders for this visit:  Elevated blood pressure reading Return for recheck BP in 3-4 weeks Will determine if needs antihypertensive at that time.   Morbid obesity ([place]) -     CBC -     CMP14+EGFR -     Lipid panel Discussed diet and exercise modifications.  BMI of 35.8. Instructed: You must burn more calories than you eat. Losing 5 percent of your body weight should be considered a success. In the longer term, losing more than 15 percent of your body weight and staying at this weight is an extremely good result. However, keep in mind that even losing 5 percent of your body weight leads to important health benefits, so try not to get discouraged if you're not able to lose more than this. Will recheck weight in 3 months.   Need for immunization against influenza -     Flu Vaccine QUAD 36+ mos IM    Patient has been counseled on age-appropriate routine health concerns for screening and prevention. These are reviewed and up-to-date. Referrals have been placed accordingly. Immunizations are up-to-date or declined.    Subjective:   Chief Complaint  Patient presents with  . Blood Pressure Check    Pt. is here to check to see if she have high blood pressure.    HPI [name] 37 y.o. female presents to office today to establish care. VRI was used to communicate directly with patient for the entire encounter including providing detailed patient instructions.  She has a history of anemia and GERD. She is here today with concerns of elevated blood pressure.    GERD Chronic. Symptoms well controlled with Protonix 54m BID. She does not take this medication every day and states she only takes as needed.  Elevated Blood Pressure She endorses having her blood pressure checked previously by a health care professional and it was reported as "high". She does not recall the reading.  She has  started exercising and has changed her dietary intake. "Eating healthier". She started one month ago and plans to continue. I will have her return in 4 weeks for BP recheck. At this time will not start any new medications. She currently denies chest pain, shortness of breath, palpitations, lightheadedness, dizziness, headaches or BLE edema.    Review of Systems  Constitutional: Negative for fever, malaise/fatigue and weight loss.  HENT: Negative.  Negative for nosebleeds.   Eyes: Negative.  Negative for blurred vision, double vision and photophobia.  Respiratory: Negative.  Negative for cough and shortness of breath.   Cardiovascular: Negative.  Negative for chest pain, palpitations and leg swelling.  Gastrointestinal: Positive for heartburn. Negative for nausea and vomiting.  Musculoskeletal: Negative.  Negative for myalgias.  Neurological: Negative.  Negative for dizziness, focal weakness, seizures and headaches.  Psychiatric/Behavioral: Negative.  Negative for suicidal ideas.    Past Medical History:  Diagnosis Date  . Amenorrhea   . Anemia   . Biliary colic   . GERD (gastroesophageal reflux disease)     Past Surgical History:  Procedure Laterality Date  . CHOLECYSTECTOMY N/A 09/22/2017   Procedure: LAPAROSCOPIC CHOLECYSTECTOMY;  Surgeon: POlean Ree MD;  Location: [facility] ORS;  Service: General;  Laterality: N/A;  . NO PAST SURGERIES    . OVARIAN CYST REMOVAL      Family History  Problem Relation Age of Onset  . Healthy Mother   . Healthy Father   .  Cancer Neg Hx     Social History Reviewed with no changes to be made today.   Outpatient Medications Prior to Visit  Medication Sig Dispense Refill  . acetaminophen (TYLENOL) 500 MG tablet Take 500-1,000 mg by mouth every 8 (eight) hours as needed for mild pain.     . pantoprazole (PROTONIX) 20 MG tablet Take 20 mg by mouth 2 (two) times daily as needed (nausea vomiting).     No facility-administered medications prior to  visit.     No Known Allergies     Objective:    BP (!) 130/91 (BP Location: Right Arm, Patient Position: Sitting, Cuff Size: Large)   Pulse 91   Temp 98.1 F (36.7 C) (Oral)   Ht _0  (1.6 m)   Wt 202 lb (91.6 kg)   LMP  (LMP Unknown)   SpO2 97%   BMI 35.78 kg/m  Wt Readings from Last 3 Encounters:  09/29/18 202 lb (91.6 kg)  10/07/17 197 lb (89.4 kg)  09/22/17 198 lb (89.8 kg)    Physical Exam  Constitutional: She is oriented to person, place, and time. She appears well-developed and well-nourished. She is cooperative.  HENT:  Head: Normocephalic and atraumatic.  Eyes: EOM are normal.  Neck: Normal range of motion.  Cardiovascular: Normal rate, regular rhythm and normal heart sounds. Exam reveals no gallop and no friction rub.  No murmur heard. Pulmonary/Chest: Effort normal and breath sounds normal. No tachypnea. No respiratory distress. She has no decreased breath sounds. She has no wheezes. She has no rhonchi. She has no rales. She exhibits no tenderness.  Abdominal: Bowel sounds are normal.  Musculoskeletal: Normal range of motion. She exhibits no edema.  Neurological: She is alert and oriented to person, place, and time. Coordination normal.  Skin: Skin is warm and dry.  Psychiatric: She has a normal mood and affect. Her behavior is normal. Judgment and thought content normal.  Nursing note and vitals reviewed.        Patient has been counseled extensively about nutrition and exercise as well as the importance of adherence with medications and regular follow-up. The patient was given clear instructions to go to ER or return to medical center if symptoms don't improve, worsen or new problems develop. The patient verbalized understanding.   Follow-up: Return in about 4 weeks (around 10/27/2018) for PAP SMEAR.   Gildardo Pounds, FNP-BC [facility] [place], [place]   09/29/2018, 10:16 AM

## 2018-09-30 LAB — CBC
HEMOGLOBIN: 13.7 g/dL (ref 11.1–15.9)
Hematocrit: 43.8 % (ref 34.0–46.6)
MCH: 24.1 pg — AB (ref 26.6–33.0)
MCHC: 31.3 g/dL — ABNORMAL LOW (ref 31.5–35.7)
MCV: 77 fL — ABNORMAL LOW (ref 79–97)
PLATELETS: 382 10*3/uL (ref 150–450)
RBC: 5.69 x10E6/uL — AB (ref 3.77–5.28)
RDW: 15 % (ref 12.3–15.4)
WBC: 7.4 10*3/uL (ref 3.4–10.8)

## 2018-09-30 LAB — CMP14+EGFR
ALBUMIN: 4.4 g/dL (ref 3.5–5.5)
ALT: 23 IU/L (ref 0–32)
AST: 20 IU/L (ref 0–40)
Albumin/Globulin Ratio: 1.5 (ref 1.2–2.2)
Alkaline Phosphatase: 75 IU/L (ref 39–117)
BILIRUBIN TOTAL: 0.3 mg/dL (ref 0.0–1.2)
BUN / CREAT RATIO: 13 (ref 9–23)
BUN: 8 mg/dL (ref 6–20)
CHLORIDE: 102 mmol/L (ref 96–106)
CO2: 23 mmol/L (ref 20–29)
Calcium: 9.5 mg/dL (ref 8.7–10.2)
Creatinine, Ser: 0.62 mg/dL (ref 0.57–1.00)
GFR, EST AFRICAN AMERICAN: 134 mL/min/{1.73_m2} (ref >59)
GFR, EST NON AFRICAN AMERICAN: 116 mL/min/{1.73_m2} (ref >59)
GLUCOSE: 97 mg/dL (ref 65–99)
Globulin, Total: 3 g/dL (ref 1.5–4.5)
Potassium: 4.5 mmol/L (ref 3.5–5.2)
Sodium: 140 mmol/L (ref 134–144)
Total Protein: 7.4 g/dL (ref 6.0–8.5)

## 2018-09-30 LAB — LIPID PANEL
Chol/HDL Ratio: 5.7 ratio — ABNORMAL HIGH (ref 0.0–4.4)
Cholesterol, Total: 193 mg/dL (ref 100–199)
HDL: 34 mg/dL — AB (ref >39)
LDL Calculated: 122 mg/dL — ABNORMAL HIGH (ref 0–99)
Triglycerides: 187 mg/dL — ABNORMAL HIGH (ref 0–149)
VLDL CHOLESTEROL CAL: 37 mg/dL (ref 5–40)

## 2018-10-04 ENCOUNTER — Telehealth: Payer: Self-pay

## 2018-10-04 NOTE — Telephone Encounter (Signed)
CMA spoke to patient to inform on results.   Patient verified DOB.  Patient understood.  Spanish interpreter Reginia Forts [phone] assist with the call.

## 2018-10-04 NOTE — Telephone Encounter (Signed)
-----   Message from Claiborne Rigg, NP sent at 10/03/2018 11:08 PM EDT ----- Your kidney and liver function are normal. Labs show slight anemia which is likely related to your menstrual cycles. Tests show increased cholesterol/lipid levels.  Work on a low fat, heart healthy diet and participate in regular aerobic exercise program by working out at least 150 minutes per week; 5 days a week-30 minutes per day. Avoid red meat, fried foods. junk foods, sodas, sugary drinks, unhealthy snacking, alcohol and smoking.  Drink at least 48oz of water per day and monitor your carbohydrate intake daily.

## 2018-11-01 ENCOUNTER — Ambulatory Visit: Payer: Self-pay | Admitting: Nurse Practitioner

## 2018-12-03 ENCOUNTER — Ambulatory Visit: Payer: Self-pay | Admitting: Nurse Practitioner

## 2018-12-10 ENCOUNTER — Ambulatory Visit: Payer: Self-pay | Attending: Nurse Practitioner | Admitting: Nurse Practitioner

## 2018-12-10 ENCOUNTER — Encounter: Payer: Self-pay | Admitting: Nurse Practitioner

## 2018-12-10 VITALS — BP 135/90 | HR 102 | Temp 98.4°F | Resp 16 | Wt 199.0 lb

## 2018-12-10 DIAGNOSIS — Z9049 Acquired absence of other specified parts of digestive tract: Secondary | ICD-10-CM | POA: Insufficient documentation

## 2018-12-10 DIAGNOSIS — R030 Elevated blood-pressure reading, without diagnosis of hypertension: Secondary | ICD-10-CM | POA: Insufficient documentation

## 2018-12-10 DIAGNOSIS — K219 Gastro-esophageal reflux disease without esophagitis: Secondary | ICD-10-CM | POA: Insufficient documentation

## 2018-12-10 DIAGNOSIS — Z124 Encounter for screening for malignant neoplasm of cervix: Secondary | ICD-10-CM

## 2018-12-10 DIAGNOSIS — Z79899 Other long term (current) drug therapy: Secondary | ICD-10-CM | POA: Insufficient documentation

## 2018-12-10 DIAGNOSIS — Z01419 Encounter for gynecological examination (general) (routine) without abnormal findings: Secondary | ICD-10-CM | POA: Insufficient documentation

## 2018-12-10 NOTE — Patient Instructions (Signed)
Plan de alimentacin DASH DASH Eating Plan DASH es la sigla en ingls de "Enfoques Alimentarios para Detener la Hipertensin" (Dietary Approaches to Stop Hypertension). El plan de alimentacin DASH ha demostrado bajar la presin arterial elevada (hipertensin). Tambin puede reducir [employer] de diabetes tipo 2, enfermedad cardaca y accidente cerebrovascular. Este plan tambin puede ayudar a Horticulturist, commercial. Consejos para seguir este plan [place]autas generales  Evite ingerir ms de 2,300 mg (miligramos) de sal (sodio) por da. Si tiene hipertensin, es posible que necesite reducir la ingesta de sodio a 1,500 mg por da.  Limite el consumo de alcohol a no ms de 53mdida por da si es mujer y no est [place] y 259midas por da si es hombre. Una medida equivale a 12oz (35552mde cerveza, 5oz (148m27me vino o 1oz (44ml43m bebidas alcohlicas de alta graduacin.  Trabaje con su mdico para mantener un peso saludable o perder peso.[place] Mediagntele cul es el peso recomendado para usted.  Realice al menos 30 minutos de ejercicio que haga que se acelere su corazn (ejercicio aerbiArboriculturistmayorHartford Financiala semanBayside Gardensas actividades pueden incluir caminar, nadar o andar en bicicleta.  Trabaje con su mdico o especialista en alimentacin y nutricin (nutricionista) para ajustar su plan alimentario a sus necesidades calricas personales. Lectura de las etiquetas de los alimentos  Verifique en las etiquetas de los alimentos, la cantidad de sodio por porcin. Elija alimentos con menos del 5 por ciento del valor diario de sodio. Generalmente, los alimentos con menos de 300 mg de sodio por porcin se encuadran dentro de este plan alimentario.  Para encontrar cereales integrales, busque la palabra "integral" como primera palabra en la lista de ingredientes. De compras  Compre productos en los que en su etiqueta diga: "bajo contenido de sodio" o "sin agregado de sal".  Compre alimentos frescos. Evite  los alimentos enlatados y comidas precocidas o congeladas. Coccin  Evite agregar sal cuando cocine. Use hierbas o aderezos sin sal, en lugar de sal de mesa o sal marina. Consulte al mdico o farmacutico antes de usar sustitutos de la sal.  No fra los alimentos. A la hora de cocinar los alimentos opte por hornearlos, hervirlos, grillarlos y asarlos a la paAdministrator, artscine con aceites cardiosaludables, como oliva, canola, soja o girasol. Planificacin de las comidas   Consuma una dieta equilibrada, que incluya lo siguiente: ? 5o ms porciones de frutas y verduSet designerte de que la mitad del plato de cada comida sean frutas y verduras. ? Hasta 6 u 8 porciones de cereales integrales por da. ? Menos de 6 onzas de carne, aves o pescado magroGames developer porcin de 3 onzas de carne tiene casi el mismo tamao que un mazo de cartas. Un huevo equivale a 1 onza. ? Dos porciones de productos lcteos descremados por da. ?Training and development officerna porcin de frutos secos, semillas o frijoles 5 veces por semana. ? Grasas cardiosaludables. Las grasas saludables llamadas cidos grasos omega-3 se encuentran en alimentos como semillas de lino y pescados de agua fra, como por ejemplo, sardinas, salmn y caballa.  Limite la cantidad que ingiere de los siguientes alimentos: ? Alimentos enlatados o envasados. ? Alimentos con alto contenido de grasa trans, como alimentos fritos. ? Alimentos con alto contenido de grasa saturada, como carne con grasa. ? Dulces, postres, bebidas azucaradas y otros alimentos con azcar agregada. ? Productos lcteos enteros.  No le agregue sal a los alimentos antes de probarlos.  Trate de comer al  menos 2 comidas vegetarianas por semana.  Consuma ms comida casera y menos de restaurante, de bufs y comida rpida.  Cuando coma en un restaurante, pida que preparen su comida con menos sal o, en lo posible, sin nada de sal. Qu alimentos se recomiendan? Los alimentos enumerados a  continuacin no constituyen una lista completa. Hable con el nutricionista sobre las mejores opciones alimenticias para usted. Cereales Pan de salvado o integral. Pasta de salvado o integral. Arroz integral. Avena. Quinua. Trigo burgol. Cereales integrales y con bajo contenido de sodio. Pan pita. Galletitas de agua con bajo contenido de grasa y sodio. Tortillas de harina integral. Verduras Verduras frescas o congeladas (crudas, al vapor, asadas o grilladas). Jugos de tomate y verduras con bajo contenido de sodio o reducidos en sodio. Salsa y pasta de tomate con bajo contenido de sodio o reducidas en sodio. Verduras enlatadas con bajo contenido de sodio o reducidas en sodio. Frutas Todas las frutas frescas, congeladas o disecadas. Frutas enlatadas en jugo natural (sin agregado de azcar). Carne y otros alimentos proteicos Pollo o pavo sin piel. Carne de pollo o de pavo molida. Cerdo desgrasado. Pescado y mariscos. Claras de huevo. Porotos, guisantes o lentejas secos. Frutos secos, mantequilla de frutos secos y semillas sin sal. Frijoles enlatados sin sal. Cortes de carne vacuna magra, desgrasada. Embutidos magros, con bajo contenido de sodio. Lcteos Leche descremada (1%) o descremada. Quesos sin grasa, con bajo contenido de grasa o descremados. Queso blanco o ricota sin grasa, con bajo contenido de sodio. Yogur semidescremado o descremado. Queso con bajo contenido de grasa y sodio. Grasas y aceites Margarinas untables que no contengan grasas trans. Aceite vegetal. [place]onesa y aderezos para ensaladas livianos o con bajo contenido de grasas (reducidos en sodio). Aceite de canola, crtamo, oliva, soja y girasol. [place]. Condimentos y otros alimentos Hierbas. Especias. Mezclas de condimentos sin sal. Palomitas de maz y pretzels sin sal. Dulces con bajo contenido de grasas. Qu alimentos no se recomiendan? Los alimentos enumerados a continuacin no constituyen una lista completa. Hable con el  nutricionista sobre las mejores opciones alimenticias para usted. Cereales Productos de panificacin hechos con grasa, como medialunas, magdalenas y algunos panes. Comidas con arroz o pasta seca listas para usar. Verduras Verduras con crema o fritas. Verduras en salsa de queso. Verduras enlatadas regulares (que no sean con bajo contenido de sodio o reducidas en sodio). Pasta y salsa de tomates enlatadas regulares (que no sean con bajo contenido de sodio o reducidas en sodio). Jugos de tomate y verduras regulares (que no sean con bajo contenido de sodio o reducidos en sodio). Pepinillos. Aceitunas. Frutas Fruta enlatada en almbar liviano o espeso. Frutas cocidas en aceite. Frutas con salsa de crema o manteca. Carne y otros alimentos proteicos Cortes de carne con grasa. Costillas. Carne frita. Tocino. Salchichas. Mortadela y otras carnes procesadas. Salame. Panceta. Perros calientes (hotdogs). Salchicha de cerdo. Frutos secos y semillas con sal. Frijoles enlatados con agregado de sal. Pescado enlatado o ahumado. Huevos enteros o yemas. Pollo o pavo con piel. Lcteos Leche entera o al 2%, crema y mitad leche y mitad crema. Queso crema entero o con toda su grasa. Yogur entero o endulzado. Quesos con toda su grasa. Sustitutos de cremas no lcteas. Coberturas batidas. Quesos para untar y quesos procesados. Grasas y aceites Mantequilla. Margarina en barra. Manteca de cerdo. Materia grasa. Mantequilla clarificada. Grasa de panceta. Aceites tropicales como aceite de coco, palmiste o palma. Condimentos y otros alimentos Palomitas de maz y pretzels con sal. Sal de   cebolla, sal de ajo, sal condimentada, sal de mesa y sal marina. Salsa [place]. Salsa trtara. Salsa barbacoa. Salsa teriyaki. Salsa de soja, incluso la que tiene contenido reducido de [place]. Salsa de carne. Salsas en lata y envasadas. Salsa de pescado. Salsa de [place]. Salsa rosada. Rbano picante envasado. [place]. Mostaza. Saborizantes y  tiernizantes para carne. Caldo en cubitos. Salsa picante y salsa tabasco. Escabeches envasados o ya preparados. Aderezos para tacos prefabricados o envasados. Salsas. Aderezos comunes para ensalada. Dnde encontrar ms informacin:  [employer] del [address], los Pulmones y Risk manager (National Heart, Lung, and Blood Institute): PopSteam.is  Asociacin Estadounidense del Corazn (American Heart Association): www.heart.org Resumen  El plan de alimentacin DASH ha demostrado bajar la presin arterial elevada (hipertensin). Tambin puede reducir [employer] de diabetes tipo 2, enfermedad cardaca y accidente cerebrovascular.  Con el plan de alimentacin DASH, deber limitar el consumo de sal (sodio) a 2,300 mg por da. Si tiene hipertensin, es posible que necesite reducir la ingesta de sodio a 1,500 mg por da.  Cuando siga el plan de alimentacin DASH, trate de comer ms frutas frescas y verduras, cereales integrales, carnes magras, lcteos descremados y grasas cardiosaludables.  Trabaje con su mdico o especialista en alimentacin y nutricin (nutricionista) para ajustar su plan alimentario a sus necesidades calricas personales. Esta informacin no tiene Theme park manager el consejo del mdico. Asegrese de hacerle al mdico cualquier pregunta que tenga. Document Released: 12/04/2011 Document Revised: 04/06/2017 Document Reviewed: 04/06/2017 Elsevier Interactive Patient Education  2018 [employer].  Prevencin de la hipertensin Preventing Hypertension La hipertensin, conocida comnmente como presin arterial alta, se produce cuando la sangre bombea en las arterias con mucha fuerza. Las arterias son vasos sanguneos que transportan la sangre desde el corazn al resto del cuerpo. Con el transcurso del [place], la hipertensin puede daar las arterias y Engineer, manufacturing systems flujo de sangre hacia partes importantes del cuerpo que incluyen el cerebro, el corazn y los riones. Con frecuencia,  la hipertensin no causa sntomas hasta que la presin arterial es muy alta. Por este motivo, es importante que controle regularmente su presin arterial. La hipertensin se puede prevenir con frecuencia con cambios en la dieta y el estilo de vida. Si ya tiene hipertensin, puede controlarla con cambios en la dieta y el estilo de vida y con medicamentos. Qu cambios en la alimentacin se pueden hacer? Mantenga una dieta saludable. Esto incluye lo siguiente:  Menor ingesta de sal (sodio). Pregntele al mdico cunto sodio puede consumir de forma segura. La recomendacin general es consumir menos de 1cucharadita (2300mg ) de sodio por da. ? No agregue sal a las comidas. ? Opte por alimentos con bajo contenido de sodio cuando realice las compras o coma fuera de casa.  Limite la cantidad de grasa en la dieta. Esto se puede lograr con [employer] o de bajo contenido de grasas e ingiriendo menor cantidad de carnes rojas.  Coma ms frutas, verduras y cereales integrales. Establezca un objetivo para comer: ? 1 a 2tazas de frutas y verduras frescas todos los [address]  [address]. ? 3 a 4porciones de cereales [employer].  Evite los alimentos y las bebidas que tengan azcares agregados.  Coma pescados que contengan grasas saludables (cidos grasos omega-3), como la caballa o el salmn.  Si necesita implementar un plan de comidas saludable, pruebe la dieta DASH. Esta dieta tiene un alto contenido de frutas, verduras y Radiation protection practitioner. Incluye poca cantidad de sodio, carnes rojas y azcares agregados. DASH es la sigla en ingls  de "Enfoques Alimentarios para Detener la Hipertensin". Qu cambios en el estilo de vida se pueden realizar?  Baje de peso si es necesario. Con tan solo bajar entre el 3% y el 5% del peso corporal puede prevenir o controlar la hipertensin. ? Por ejemplo, si su peso actual es de 200libras (91kg), una prdida entre el 3% y el 5% de su peso significa perder  entre 6 y 10libras (2,7 a 4,5kg). ? Pdale al mdico que le recomiende una dieta y un plan de ejercicios para bajar de peso de forma segura.  Ejerctese lo suficiente. Debe realizar al menos de ejercicios de intensidad moderada todas las semanas. ? Theatre stage manager en sesiones cortas de ejercicios, varias veces al da, o puede realizar sesiones ms largas, pero menos veces por semana. Por ejemplo, puede realizar una caminata enrgica o andar en bicicleta durante , 3veces al da, durante 5das a la semana.  Encuentre maneras de reducir el estrs, como hacer ejercicios, Primary school teacher, Optometrist o tomar una clase de yoga. Si necesita ayuda para reducir [employer] de estrs, consulte al mdico.  No fume. Esto incluye los cigarrillos electrnicos. Las sustancias qumicas presentes en los productos con tabaco y nicotina elevan su presin arterial cada vez que fuma. Si necesita ayuda para dejar de fumar, consulte al mdico.  Evite el alcohol. Si bebe alcohol, limite el consumo a no ms de por da si es mujer y no est [place], y por da si es hombre. Una medida equivale a 12onzas de cerveza, 5onzas de vino o 1onzas de bebidas alcohlicas de alta graduacin. Por qu son importantes estos cambios? Los [employer] dieta y el estilo de vida pueden ayudar a prevenir la hipertensin y a Passenger transport manager al mejorar su calidad de vida. Si tiene hipertensin, Therapist, art ayudarn a Theatre manager y a Financial trader ms importantes, como, por ejemplo:  El endurecimiento y Catering manager de las arterias que proveen sangre a: ? Su corazn. Esto puede producirle un infarto de miocardio. ? Su cerebro. Esto puede ser la causa de un accidente cerebrovascular. ? Los riones. Esto puede causar insuficiencia renal.  Estrs en el msculo cardaco, lo que puede producir insuficiencia cardaca.  Qu puedo hacer para reducir mis  riesgos?  Trabaje junto al mdico para desarrollar un plan de prevencin de la hipertensin que funcione para usted. Siga su plan y concurra a todas las visitas de control como se lo haya indicado el mdico.  Aprenda a medir su presin arterial en casa. Asegrese de Solicitor su objetivo de presin arterial, como se lo haya indicado el mdico. Cmo se trata? Adems de los cambios en la dieta y el estilo de vida, [place] mdico podr indicarle medicamentos para ayudarle a Publishing copy su presin arterial. Tal vez deba probar distintos medicamentos hasta encontrar el ms adecuado para usted. Quiz necesite tomar ms de uno. Tome los medicamentos de venta libre y los recetados solamente como se lo haya indicado el mdico. Dnde encontrar apoyo: Su mdico puede ayudarle a prevenir la hipertensin y Pharmacologist su presin arterial en un nivel saludable. Su hospital o comunidad locales tambin pueden proporcionarle servicios y programas de prevencin. La Asociacin Americana del Corazn (American Heart Association) ofrece un red de soporte en lnea en: https://www.lee.net/ Dnde encontrar ms informacin: Obtenga ms informacin sobre la hipertensin en:  Training and development officer del [place], del Pulmn y de Risk manager (National Heart, Lung, and Blood Institute): https://www.peterson.org/  Centros para el control y  la prevencin de Child psychotherapistenfermedades (Centers for Disease Control and Prevention, CDC): AboutHD.co.nzwww.cdc.gov/bloodpressure  Market researcherAcademia Estadounidense de Mdicos de [place]am[place] (American Academy of Family Physicians): http://familydoctor.org/familydoctor/en/diseases-conditions/high-blood-pressure.printerview.all.html  Obtenga ms informacin sobre la dieta DASH en:  Training and development officernstituto Nacional del Broeck Pointeorazn, del Pulmn y de Risk managerla [place] (National Heart, Lung, and Blood Institute): WedMap.itwww.nhlbi.nih.gov/health/health-topics/topics/dash  Comunquese con un mdico si:  Piensa que tiene Archivistuna reaccin  alrgica a los medicamentos que ha tomado.  Tiene mareos o dolores de cabeza con Naval architectrecurrencia.  Tiene hinchazn en los tobillos.  Tiene problemas de visin. Resumen  La hipertensin con frecuencia no provoca sntomas hasta que la presin arterial es muy alta. Es importante que controle regularmente su presin arterial.  Los cambios en la dieta y el estilo de vida son los pasos ms importantes hacia la prevencin de la hipertensin.  Si mantiene su presin arterial en un nivel saludable, podr prevenir complicaciones como infarto de miocardio, insuficiencia cardaca, accidente cerebrovascular e insuficiencia renal.  Trabaje junto al mdico para desarrollar un plan de prevencin de la hipertensin que funcione para usted. Esta informacin no tiene Theme park managercomo fin reemplazar el consejo del mdico. Asegrese de hacerle al mdico cualquier pregunta que tenga. Document Released: 12/30/2015 Document Revised: 03/25/2017 Document Reviewed: 12/30/2015 Elsevier Interactive Patient Education  [employer]2018 Elsevier Inc.

## 2018-12-10 NOTE — Progress Notes (Signed)
Assessment & Plan:  [name] was seen today for gynecologic exam.  Diagnoses and all orders for this visit:  Encounter for Papanicolaou smear for cervical cancer screening -     Cytology - PAP    Patient has been counseled on age-appropriate routine health concerns for screening and prevention. These are reviewed and up-to-date. Referrals have been placed accordingly. Immunizations are up-to-date or declined.    Subjective:   Chief Complaint  Patient presents with  . Gynecologic Exam   HPI [name] 37 y.o. female presents to office today for PAP smear. Blood pressure is slightly elevated today. I have recommended she lose at least 10lbs over the next 3 months, exercise and follow a DASH Diet. Will revisit in 90 days. She denies chest pain, shortness of breath, palpitations, lightheadedness, dizziness, headaches or BLE edema.   Review of Systems  Constitutional: Negative.  Negative for chills, fever, malaise/fatigue and weight loss.  Respiratory: Negative.  Negative for cough, shortness of breath and wheezing.   Cardiovascular: Negative.  Negative for chest pain, orthopnea and leg swelling.  Gastrointestinal: Negative for abdominal pain.  Genitourinary: Negative.  Negative for flank pain.  Skin: Negative.  Negative for rash.  Psychiatric/Behavioral: Negative for suicidal ideas.    Past Medical History:  Diagnosis Date  . Amenorrhea   . Anemia   . Biliary colic   . GERD (gastroesophageal reflux disease)     Past Surgical History:  Procedure Laterality Date  . CHOLECYSTECTOMY N/A 09/22/2017   Procedure: LAPAROSCOPIC CHOLECYSTECTOMY;  Surgeon: Henrene Dodge, MD;  Location: [facility] ORS;  Service: General;  Laterality: N/A;  . NO PAST SURGERIES    . OVARIAN CYST REMOVAL      Family History  Problem Relation Age of Onset  . Healthy Mother   . Healthy Father   . Cancer Neg Hx     Social History Reviewed with no changes to be made today.   Outpatient  Medications Prior to Visit  Medication Sig Dispense Refill  . acetaminophen (TYLENOL) 500 MG tablet Take 500-1,000 mg by mouth every 8 (eight) hours as needed for mild pain.     . pantoprazole (PROTONIX) 20 MG tablet Take 20 mg by mouth 2 (two) times daily as needed (nausea vomiting).     No facility-administered medications prior to visit.     No Known Allergies     Objective:    BP 135/90   Pulse (!) 102   Temp 98.4 F (36.9 C) (Oral)   Resp 16   Wt 199 lb (90.3 kg)   SpO2 97%   BMI 35.25 kg/m  Wt Readings from Last 3 Encounters:  12/10/18 199 lb (90.3 kg)  09/29/18 202 lb (91.6 kg)  10/07/17 197 lb (89.4 kg)    Physical Exam Constitutional:      Appearance: She is well-developed.  HENT:     Head: Normocephalic.  Cardiovascular:     Rate and Rhythm: Normal rate and regular rhythm.     Heart sounds: Normal heart sounds.  Pulmonary:     Effort: Pulmonary effort is normal.     Breath sounds: Normal breath sounds.  Chest:     Breasts:        Right: Normal. No swelling, bleeding, inverted nipple, mass, nipple discharge, skin change or tenderness.        Left: No swelling, inverted nipple, mass, nipple discharge, skin change or tenderness.  Abdominal:     General: Bowel sounds are normal.  Palpations: Abdomen is soft.     Hernia: There is no hernia in the right inguinal area or left inguinal area.  Genitourinary:    Labia:        Right: No rash, tenderness, lesion or injury.        Left: No rash, tenderness, lesion or injury.      Vagina: Normal. No signs of injury and foreign body. No vaginal discharge, erythema, tenderness or bleeding.     Cervix: No cervical motion tenderness or friability.     Uterus: Not deviated and not enlarged.      Adnexa:        Right: No mass, tenderness or fullness.         Left: No mass, tenderness or fullness.       Rectum: Normal. No external hemorrhoid.  Lymphadenopathy:     Lower Body: No right inguinal adenopathy. No left  inguinal adenopathy.  Skin:    General: Skin is warm and dry.  Neurological:     Mental Status: She is alert and oriented to person, place, and time.  Psychiatric:        Behavior: Behavior normal.        Thought Content: Thought content normal.        Judgment: Judgment normal.         Patient has been counseled extensively about nutrition and exercise as well as the importance of adherence with medications and regular follow-up. The patient was given clear instructions to go to ER or return to medical center if symptoms don't improve, worsen or new problems develop. The patient verbalized understanding.   Follow-up: Return in about 3 months (around 03/11/2019) for BP.   Claiborne RiggZelda W Jadavion Spoelstra, FNP-BC [facility] [place], [place] [phone]   12/10/2018, 1:54 PM

## 2018-12-15 LAB — CYTOLOGY - PAP
BACTERIAL VAGINITIS: NEGATIVE
CHLAMYDIA, DNA PROBE: NEGATIVE
Candida vaginitis: NEGATIVE
DIAGNOSIS: NEGATIVE
HPV: NOT DETECTED
NEISSERIA GONORRHEA: NEGATIVE
Trichomonas: NEGATIVE

## 2018-12-24 ENCOUNTER — Telehealth: Payer: Self-pay | Admitting: *Deleted

## 2018-12-24 NOTE — Telephone Encounter (Signed)
-----   Message from Claiborne RiggZelda W Fleming, NP sent at 12/16/2018 12:44 AM EST ----- PAP smear normal. Next PAP due 2022

## 2018-12-24 NOTE — Telephone Encounter (Signed)
Medical Assistant used [employer] to contact patient.  Interpreter Name: Luiz BlareLady Interpreter #: [phone] Patient was not available, [employer] left patient a voicemail. !!Please inform patient of PAP being normal and no concerns. Next PAP will be 2022!!!

## 2019-01-20 ENCOUNTER — Ambulatory Visit: Payer: Self-pay | Attending: Internal Medicine | Admitting: Internal Medicine

## 2019-01-20 ENCOUNTER — Other Ambulatory Visit: Payer: Self-pay

## 2019-01-20 ENCOUNTER — Encounter: Payer: Self-pay | Admitting: Internal Medicine

## 2019-01-20 VITALS — BP 133/90 | HR 106 | Temp 99.2°F | Resp 16 | Wt 200.0 lb

## 2019-01-20 DIAGNOSIS — M7918 Myalgia, other site: Secondary | ICD-10-CM

## 2019-01-20 DIAGNOSIS — N644 Mastodynia: Secondary | ICD-10-CM | POA: Insufficient documentation

## 2019-01-20 DIAGNOSIS — M791 Myalgia, unspecified site: Secondary | ICD-10-CM | POA: Insufficient documentation

## 2019-01-20 DIAGNOSIS — Z975 Presence of (intrauterine) contraceptive device: Secondary | ICD-10-CM | POA: Insufficient documentation

## 2019-01-20 MED ORDER — IBUPROFEN 600 MG PO TABS: 600.0000 mg | ORAL_TABLET | Freq: Two times a day (BID) | ORAL | 0 refills | Status: DC | PRN

## 2019-01-20 MED ORDER — CYCLOBENZAPRINE HCL 5 MG PO TABS: 5.0000 mg | ORAL_TABLET | Freq: Every evening | ORAL | 0 refills | Status: DC | PRN

## 2019-01-20 MED FILL — IBUPROFEN 600 MG TABLET: 600 | 15 days supply | Qty: 30 | Fill #0

## 2019-01-20 MED FILL — CYCLOBENZAPRINE 5 MG TABLET: 5 | 20 days supply | Qty: 20 | Fill #0

## 2019-01-20 NOTE — Progress Notes (Signed)
Subjective:    Patient ID: [name], female    DOB: Jul 01, 1981, 38 y.o.   MRN: [id number]  HPI  Patient here for right breast pain x 3 days. (Used telephonic interpreter services) Currently, breast pain (nipple) resolved but associated with R>L shoulder and neck pains which persist. No trauma. No discharge. No mass. No redness, swelling or bruising. Menstrual cycle irregular and unpredictable related to IUD: LMP 10Dec19  Past Medical History:  Diagnosis Date  . Amenorrhea   . AMENORRHEA 04/04/2009   Qualifier: Diagnosis of  By: Delrae Alfred MD, Lanora Manis    . Anemia   . Biliary colic   . GERD (gastroesophageal reflux disease)   . Symptomatic cholelithiasis     Review of Systems  Constitutional: Positive for fatigue. Negative for unexpected weight change.  Respiratory: Negative for cough and shortness of breath.   Cardiovascular: Negative for chest pain and leg swelling.       Objective:    Physical Exam Constitutional:      General: She is not in acute distress.    Appearance: Normal appearance. She is obese. She is not toxic-appearing.  Neck:     Musculoskeletal: Full passive range of motion without pain and normal range of motion. No neck rigidity, pain with movement, torticollis, spinous process tenderness or muscular tenderness.  Cardiovascular:     Rate and Rhythm: Normal rate and regular rhythm.     Pulses: Normal pulses.     Heart sounds: Normal heart sounds.  Pulmonary:     Effort: Pulmonary effort is normal.     Breath sounds: Normal breath sounds. No wheezing or rhonchi.  Chest:     Chest wall: No mass, deformity, swelling or tenderness.     Breasts: Breasts are symmetrical.        Right: Normal.        Left: Normal.  Neurological:     Mental Status: She is alert.     BP 133/90 (BP Location: Right Arm, Patient Position: Sitting, Cuff Size: Large)   Pulse (!) 106   Temp 99.2 F (37.3 C) (Oral)   Resp 16   Wt 200 lb (90.7 kg)   LMP 11/28/2018    SpO2 97%   BMI 35.43 kg/m  Wt Readings from Last 3 Encounters:  01/20/19 200 lb (90.7 kg)  12/10/18 199 lb (90.3 kg)  09/29/18 202 lb (91.6 kg)     Lab Results  Component Value Date   WBC 7.4 09/29/2018   HGB 13.7 09/29/2018   HCT 43.8 09/29/2018   PLT 382 09/29/2018   GLUCOSE 97 09/29/2018   CHOL 193 09/29/2018   TRIG 187 (H) 09/29/2018   HDL 34 (L) 09/29/2018   LDLCALC 122 (H) 09/29/2018   ALT 23 09/29/2018   AST 20 09/29/2018   NA 140 09/29/2018   K 4.5 09/29/2018   CL 102 09/29/2018   CREATININE 0.62 09/29/2018   BUN 8 09/29/2018   CO2 23 09/29/2018   TSH 2.11 10/21/2016    No results found.     Assessment & Plan:   Problem List Items Addressed This Visit    Myalgia - Primary    May be related to recent exertion and lifting? Reports symptoms improved for short time with OTC ibuprofen. No abnormality on breast exam and these symptoms have resolved. Trial ibuprofen 600 BID prn and muscle relaxer for qhs. Reassurance provided - follow up prn if recurrent or unresolved with symptomatic care       Other  Visit Diagnoses    Breast pain, right           Rene PaciValerie Rasheeda Mulvehill, MD

## 2019-01-20 NOTE — Assessment & Plan Note (Signed)
May be related to recent exertion and lifting? Reports symptoms improved for short time with OTC ibuprofen. No abnormality on breast exam and these symptoms have resolved. Trial ibuprofen 600 BID prn and muscle relaxer for qhs. Reassurance provided - follow up prn if recurrent or unresolved with symptomatic care

## 2019-01-20 NOTE — Patient Instructions (Signed)
It was good to see you today.  We have reviewed your prior records including labs and tests today  Use ibuprofen up to twice daily as needed for muscle aches and pains -  Also use muscle relaxer at night as needed for muscle ache unrelieved by ibuprofen   Your prescription(s) have been submitted to your pharmacy. Please take as directed and contact our office if you believe you are having problem(s) with the medication(s).

## 2019-01-20 NOTE — Progress Notes (Signed)
Feeling pain in right arm, shoulder, and back Right nipple pain, stabbing sensation x 1  Day then progressing to whole breast.

## 2019-02-03 ENCOUNTER — Encounter: Payer: Self-pay | Admitting: Family Medicine

## 2019-02-04 ENCOUNTER — Ambulatory Visit: Payer: Self-pay | Admitting: Family Medicine

## 2019-02-04 ENCOUNTER — Ambulatory Visit: Payer: Self-pay

## 2019-07-15 ENCOUNTER — Ambulatory Visit: Payer: Self-pay | Attending: Nurse Practitioner | Admitting: Nurse Practitioner

## 2019-07-15 ENCOUNTER — Other Ambulatory Visit: Payer: Self-pay

## 2019-07-15 ENCOUNTER — Other Ambulatory Visit: Payer: Self-pay | Admitting: Nurse Practitioner

## 2019-07-15 ENCOUNTER — Encounter: Payer: Self-pay | Admitting: Nurse Practitioner

## 2019-07-15 VITALS — BP 126/85 | HR 111 | Temp 99.1°F | Ht 63.0 in | Wt 199.0 lb

## 2019-07-15 DIAGNOSIS — R000 Tachycardia, unspecified: Secondary | ICD-10-CM

## 2019-07-15 DIAGNOSIS — H65191 Other acute nonsuppurative otitis media, right ear: Secondary | ICD-10-CM

## 2019-07-15 DIAGNOSIS — E782 Mixed hyperlipidemia: Secondary | ICD-10-CM

## 2019-07-15 MED ORDER — CETIRIZINE-PSEUDOEPHEDRINE ER 5-120 MG PO TB12: 1.0000 | ORAL_TABLET | Freq: Two times a day (BID) | ORAL | 0 refills | Status: AC

## 2019-07-15 MED ORDER — FLUTICASONE PROPIONATE 50 MCG/ACT NA SUSP: 2.0000 | Freq: Every day | NASAL | 6 refills | Status: DC

## 2019-07-15 MED ORDER — LIDOCAINE HCL 2 % EX GEL: CUTANEOUS | 3 refills | Status: DC

## 2019-07-15 MED FILL — FLUTICASONE PROP 50 MCG SPR: 50 | 16 days supply | Qty: 16 | Fill #0

## 2019-07-15 NOTE — Progress Notes (Signed)
Assessment & Plan:  [name] was seen today for ear problem.  Diagnoses and all orders for this visit:  Tachycardia -     CBC -     CMP14+EGFR -     TSH  Mixed hyperlipidemia -     Lipid panel INSTRUCTIONS: Work on a low fat, heart healthy diet and participate in regular aerobic exercise program by working out at least 150 minutes per week; 5 days a week-30 minutes per day. Avoid red meat, fried foods. junk foods, sodas, sugary drinks, unhealthy snacking, alcohol and smoking.  Drink at least 48oz of water per day and monitor your carbohydrate intake daily.    Acute effusion of right ear -     cetirizine-pseudoephedrine (ZYRTEC-D) 5-120 MG tablet; Take 1 tablet by mouth 2 (two) times daily for 7 days. Will pick up today -     fluticasone (FLONASE) 50 MCG/ACT nasal spray; Place 2 sprays into both nostrils daily.    Patient has been counseled on age-appropriate routine health concerns for screening and prevention. These are reviewed and up-to-date. Referrals have been placed accordingly. Immunizations are up-to-date or declined.    Subjective:   Chief Complaint  Patient presents with  . Ear Problem    Patient stated her right ear feels full and humming noise for a week. No pain.    HPI [name] 38 y.o. female presents to office today with right ear fullness/pain.  Ear Pain: Patient presents with right ear pain.  Symptoms include plugged sensation in the right ear and pulsation. Symptoms began a few weeks ago and are unchanged since that time. Patient denies chills, fever, nasal congestion, nonproductive cough, productive cough, sore throat and headaches and facial pain. Ear history: 0 previous ear infections.    Review of Systems  Constitutional: Negative for fever, malaise/fatigue and weight loss.  HENT: Positive for ear pain. Negative for congestion, ear discharge, hearing loss, nosebleeds, sinus pain, sore throat and tinnitus.   Eyes: Negative.  Negative for  blurred vision, double vision and photophobia.  Respiratory: Negative.  Negative for cough, shortness of breath and stridor.   Cardiovascular: Negative.  Negative for chest pain, palpitations and leg swelling.  Gastrointestinal: Negative.  Negative for heartburn, nausea and vomiting.  Musculoskeletal: Negative.  Negative for myalgias.  Neurological: Negative.  Negative for dizziness, focal weakness, seizures and headaches.  Psychiatric/Behavioral: Negative.  Negative for suicidal ideas.    Past Medical History:  Diagnosis Date  . AMENORRHEA 04/04/2009   Qualifier: Diagnosis of  By: Amil Amen MD, Benjamine Mola    . Anemia   . Biliary colic   . GERD (gastroesophageal reflux disease)   . Symptomatic cholelithiasis     Past Surgical History:  Procedure Laterality Date  . CHOLECYSTECTOMY N/A 09/22/2017   Procedure: LAPAROSCOPIC CHOLECYSTECTOMY;  Surgeon: Olean Ree, MD;  Location: [facility] ORS;  Service: General;  Laterality: N/A;  . OVARIAN CYST REMOVAL      Family History  Problem Relation Age of Onset  . Healthy Mother   . Healthy Father   . Cancer Neg Hx     Social History Reviewed with no changes to be made today.   Outpatient Medications Prior to Visit  Medication Sig Dispense Refill  . acetaminophen (TYLENOL) 500 MG tablet Take 500-1,000 mg by mouth every 8 (eight) hours as needed for mild pain.     . cyclobenzaprine (FLEXERIL) 5 MG tablet Take 1 tablet (5 mg total) by mouth at bedtime as needed for muscle spasms. (Patient not  taking: Reported on 07/15/2019) 20 tablet 0  . ibuprofen (ADVIL,MOTRIN) 600 MG tablet Take 1 tablet (600 mg total) by mouth 2 (two) times daily as needed for moderate pain. (Patient not taking: Reported on 07/15/2019) 30 tablet 0  . pantoprazole (PROTONIX) 20 MG tablet Take 20 mg by mouth 2 (two) times daily as needed (nausea vomiting).     No facility-administered medications prior to visit.     No Known Allergies     Objective:    BP 126/85 (BP  Location: Right Arm, Patient Position: Sitting, Cuff Size: Large)   Pulse (!) 111   Temp 99.1 F (37.3 C) (Oral)   Ht '5\' 3"'  (1.6 m)   Wt 199 lb (90.3 kg)   LMP  (LMP Unknown) Comment: Pt. stated her menstrual cycle irregular.   SpO2 97%   BMI 35.25 kg/m  Wt Readings from Last 3 Encounters:  07/15/19 199 lb (90.3 kg)  01/20/19 200 lb (90.7 kg)  12/10/18 199 lb (90.3 kg)    Physical Exam Vitals signs and nursing note reviewed.  Constitutional:      Appearance: She is well-developed.  HENT:     Head: Normocephalic and atraumatic.     Right Ear: Hearing, ear canal and external ear normal. A middle ear effusion is present.     Left Ear: Hearing, tympanic membrane, ear canal and external ear normal.  Neck:     Musculoskeletal: Normal range of motion.  Cardiovascular:     Rate and Rhythm: Regular rhythm. Tachycardia present.     Heart sounds: Normal heart sounds. No murmur. No friction rub. No gallop.   Pulmonary:     Effort: Pulmonary effort is normal. No tachypnea or respiratory distress.     Breath sounds: Normal breath sounds. No decreased breath sounds, wheezing, rhonchi or rales.  Chest:     Chest wall: No tenderness.  Abdominal:     General: Bowel sounds are normal.     Palpations: Abdomen is soft.  Musculoskeletal: Normal range of motion.  Skin:    General: Skin is warm and dry.  Neurological:     Mental Status: She is alert and oriented to person, place, and time.     Coordination: Coordination normal.  Psychiatric:        Behavior: Behavior normal. Behavior is cooperative.        Thought Content: Thought content normal.        Judgment: Judgment normal.          Patient has been counseled extensively about nutrition and exercise as well as the importance of adherence with medications and regular follow-up. The patient was given clear instructions to go to ER or return to medical center if symptoms don't improve, worsen or new problems develop. The patient  verbalized understanding.   Follow-up: Return in about 1 week (around 07/22/2019) for nurse visit for Tachycardia; check manual pulse. If elevated. Needs EKG.Gildardo Pounds, FNP-BC [facility] [place], [place]   07/15/2019, 5:48 PM

## 2019-07-16 LAB — CBC
Hematocrit: 33.2 % — ABNORMAL LOW (ref 34.0–46.6)
Hemoglobin: 9.9 g/dL — ABNORMAL LOW (ref 11.1–15.9)
MCH: 22.6 pg — ABNORMAL LOW (ref 26.6–33.0)
MCHC: 29.8 g/dL — ABNORMAL LOW (ref 31.5–35.7)
MCV: 76 fL — ABNORMAL LOW (ref 79–97)
Platelets: 329 10*3/uL (ref 150–450)
RBC: 4.38 x10E6/uL (ref 3.77–5.28)
RDW: 18 % — ABNORMAL HIGH (ref 11.7–15.4)
WBC: 8.1 10*3/uL (ref 3.4–10.8)

## 2019-07-16 LAB — LIPID PANEL
Chol/HDL Ratio: 5.5 ratio — ABNORMAL HIGH (ref 0.0–4.4)
Cholesterol, Total: 171 mg/dL (ref 100–199)
HDL: 31 mg/dL — ABNORMAL LOW (ref >39)
LDL Calculated: 84 mg/dL (ref 0–99)
Triglycerides: 278 mg/dL — ABNORMAL HIGH (ref 0–149)
VLDL Cholesterol Cal: 56 mg/dL — ABNORMAL HIGH (ref 5–40)

## 2019-07-16 LAB — CMP14+EGFR
ALT: 18 IU/L (ref 0–32)
AST: 18 IU/L (ref 0–40)
Albumin/Globulin Ratio: 1.6 (ref 1.2–2.2)
Albumin: 4.2 g/dL (ref 3.8–4.8)
Alkaline Phosphatase: 62 IU/L (ref 39–117)
BUN/Creatinine Ratio: 17 (ref 9–23)
BUN: 10 mg/dL (ref 6–20)
Bilirubin Total: <0.2 mg/dL (ref 0.0–1.2)
CO2: 18 mmol/L — ABNORMAL LOW (ref 20–29)
Calcium: 8.8 mg/dL (ref 8.7–10.2)
Chloride: 102 mmol/L (ref 96–106)
Creatinine, Ser: 0.58 mg/dL (ref 0.57–1.00)
GFR calc Af Amer: 136 mL/min/{1.73_m2} (ref >59)
GFR calc non Af Amer: 118 mL/min/{1.73_m2} (ref >59)
Globulin, Total: 2.6 g/dL (ref 1.5–4.5)
Glucose: 95 mg/dL (ref 65–99)
Potassium: 4.2 mmol/L (ref 3.5–5.2)
Sodium: 136 mmol/L (ref 134–144)
Total Protein: 6.8 g/dL (ref 6.0–8.5)

## 2019-07-16 LAB — TSH: TSH: 1.78 u[IU]/mL (ref 0.450–4.500)

## 2019-07-21 ENCOUNTER — Telehealth: Payer: Self-pay

## 2019-07-21 NOTE — Telephone Encounter (Signed)
-----   Message from Gildardo Pounds, NP sent at 07/17/2019 10:44 AM EDT ----- Labs do show anemia. Would like to repeat at your next office visit. This could be related to your menstrual cycles. Cholesterol levels are very elevated. You can take omega 3 1 capsule twice per day to help lower your risk for heart attack or stroke. Electrolytes as well as liver and kidney function ar normal. Thyroid level is normal.

## 2019-07-21 NOTE — Telephone Encounter (Signed)
CMA attempt to reach patient to inform on lab results.  Pt. Did not answer and left a VM for a call back.

## 2019-08-31 MED FILL — FLUTICASONE PROP 50 MCG SPR: 50 | 16 days supply | Qty: 16 | Fill #1

## 2019-10-11 ENCOUNTER — Ambulatory Visit: Payer: Self-pay | Admitting: Pharmacist

## 2019-10-20 ENCOUNTER — Ambulatory Visit: Payer: Self-pay | Attending: Family Medicine | Admitting: Physician Assistant

## 2019-10-20 ENCOUNTER — Other Ambulatory Visit: Payer: Self-pay

## 2019-10-20 DIAGNOSIS — D649 Anemia, unspecified: Secondary | ICD-10-CM

## 2019-10-20 DIAGNOSIS — Z789 Other specified health status: Secondary | ICD-10-CM

## 2019-10-20 DIAGNOSIS — L659 Nonscarring hair loss, unspecified: Secondary | ICD-10-CM

## 2019-10-20 DIAGNOSIS — R5383 Other fatigue: Secondary | ICD-10-CM

## 2019-10-20 NOTE — Progress Notes (Signed)
Hair loss

## 2019-10-20 NOTE — Progress Notes (Signed)
Patient ID: [name], female   DOB: 1981-12-07, 38 y.o.   MRN: [id number] Virtual Visit via Telephone Note  I connected with Alda Berthold on 10/20/19 at  2:50 PM EDT by telephone and verified that I am speaking with the correct person using two identifiers.   I discussed the limitations, risks, security and privacy concerns of performing an evaluation and management service by telephone and the availability of in person appointments. I also discussed with the patient that there may be a patient responsible charge related to this service. The patient expressed understanding and agreed to proceed.  Patient location:  home My Location:  home office Persons on the call:  Me, the patient, and the interpreter  History of Present Illness:   Patient has been having hair loss for about 2 months.  She is taking OTC iron once daily(previous anemia).  No other symptoms she can think of.  Periods are regular and she is taking OCP now.  No rash.    Observations/Objective: NAD.  A&Ox3   Assessment and Plan: 1. Anemia, unspecified type Continue iron once daily - CBC with Differential/Platelet; Future - Iron, TIBC and Ferritin Panel; Future  2. Hair loss - Thyroid Panel With TSH; Future  3. Other fatigue - Vitamin D, 25-hydroxy; Future  4. Language barrier [employer] used and additional time performing visit was required.   Follow Up Instructions: 3 months with PCP   I discussed the assessment and treatment plan with the patient. The patient was provided an opportunity to ask questions and all were answered. The patient agreed with the plan and demonstrated an understanding of the instructions.   The patient was advised to call back or seek an in-person evaluation if the symptoms worsen or if the condition fails to improve as anticipated.  I provided 13 minutes of non-face-to-face time during this encounter.   Freeman Caldron, PA-C

## 2019-10-31 ENCOUNTER — Other Ambulatory Visit: Payer: Self-pay

## 2020-07-21 ENCOUNTER — Emergency Department (HOSPITAL_COMMUNITY)
Admission: EM | Admit: 2020-07-21 | Discharge: 2020-07-21 | Disposition: A | Discharge status: Home / Self Care | Payer: Self-pay | Attending: Emergency Medicine | Admitting: Emergency Medicine

## 2020-07-21 ENCOUNTER — Other Ambulatory Visit: Payer: Self-pay

## 2020-07-21 ENCOUNTER — Encounter (HOSPITAL_COMMUNITY): Payer: Self-pay

## 2020-07-21 ENCOUNTER — Emergency Department (HOSPITAL_COMMUNITY): Payer: Self-pay

## 2020-07-21 DIAGNOSIS — N938 Other specified abnormal uterine and vaginal bleeding: Secondary | ICD-10-CM | POA: Insufficient documentation

## 2020-07-21 DIAGNOSIS — R6883 Chills (without fever): Secondary | ICD-10-CM | POA: Insufficient documentation

## 2020-07-21 DIAGNOSIS — R1013 Epigastric pain: Secondary | ICD-10-CM

## 2020-07-21 DIAGNOSIS — R079 Chest pain, unspecified: Secondary | ICD-10-CM | POA: Insufficient documentation

## 2020-07-21 DIAGNOSIS — R0602 Shortness of breath: Secondary | ICD-10-CM | POA: Insufficient documentation

## 2020-07-21 LAB — COMPREHENSIVE METABOLIC PANEL
ALT: 19 U/L (ref 0–44)
AST: 21 U/L (ref 15–41)
Albumin: 4 g/dL (ref 3.5–5.0)
Alkaline Phosphatase: 54 U/L (ref 38–126)
Anion gap: 11 (ref 5–15)
BUN: 8 mg/dL (ref 6–20)
CO2: 25 mmol/L (ref 22–32)
Calcium: 8.9 mg/dL (ref 8.9–10.3)
Chloride: 107 mmol/L (ref 98–111)
Creatinine, Ser: 0.56 mg/dL (ref 0.44–1.00)
GFR calc Af Amer: >60 mL/min (ref >60)
GFR calc non Af Amer: >60 mL/min (ref >60)
Glucose, Bld: 99 mg/dL (ref 70–99)
Potassium: 3.6 mmol/L (ref 3.5–5.1)
Sodium: 143 mmol/L (ref 135–145)
Total Bilirubin: 0.5 mg/dL (ref 0.3–1.2)
Total Protein: 7.7 g/dL (ref 6.5–8.1)

## 2020-07-21 LAB — CBC WITH DIFFERENTIAL/PLATELET
Abs Immature Granulocytes: 0.03 10*3/uL (ref 0.00–0.07)
Basophils Absolute: 0 10*3/uL (ref 0.0–0.1)
Basophils Relative: 1 %
Eosinophils Absolute: 0 10*3/uL (ref 0.0–0.5)
Eosinophils Relative: 0 %
HCT: 39.5 % (ref 36.0–46.0)
Hemoglobin: 12 g/dL (ref 12.0–15.0)
Immature Granulocytes: 0 %
Lymphocytes Relative: 28 %
Lymphs Abs: 2.2 10*3/uL (ref 0.7–4.0)
MCH: 23.9 pg — ABNORMAL LOW (ref 26.0–34.0)
MCHC: 30.4 g/dL (ref 30.0–36.0)
MCV: 78.7 fL — ABNORMAL LOW (ref 80.0–100.0)
Monocytes Absolute: 0.4 10*3/uL (ref 0.1–1.0)
Monocytes Relative: 5 %
Neutro Abs: 5.3 10*3/uL (ref 1.7–7.7)
Neutrophils Relative %: 66 %
Platelets: 392 10*3/uL (ref 150–400)
RBC: 5.02 MIL/uL (ref 3.87–5.11)
RDW: 14.9 % (ref 11.5–15.5)
WBC: 8.1 10*3/uL (ref 4.0–10.5)
nRBC: 0 % (ref 0.0–0.2)

## 2020-07-21 LAB — I-STAT BETA HCG BLOOD, ED (NOT ORDERABLE): I-stat hCG, quantitative: <5 m[IU]/mL (ref <5)

## 2020-07-21 LAB — TROPONIN I (HIGH SENSITIVITY): Troponin I (High Sensitivity): <2 ng/L (ref <18)

## 2020-07-21 LAB — TSH: TSH: 1.434 u[IU]/mL (ref 0.350–4.500)

## 2020-07-21 MED ORDER — ALUM & MAG HYDROXIDE-SIMETH 200-200-20 MG/5ML PO SUSP
30.0000 mL | Freq: Once | ORAL | Status: AC
  Administered 2020-07-21: 30 mL via ORAL
  Filled 2020-07-21: qty 30

## 2020-07-21 NOTE — ED Triage Notes (Signed)
With the assistance of Wall-E, our translator device, she tells me that "for months now I sometimes feels anxiety and my belly feels 'cold'. Also, when I get that it feels like my heart is going real fast"..."usually this goes away, but I'm here today because I've been feeling this way for three days straight". EKG performed at triage.

## 2020-07-21 NOTE — ED Provider Notes (Signed)
[facility]-EMERGENCY DEPT Provider Note   CSN: [id number] Arrival date & time: 07/21/20  1203     History No chief complaint on file.   [name] is a 39 y.o. female.  39 yo F with a chief complaints of and nervousness in her stomach and a feeling of chills.  Is been off and on for about a year now.  Patient states that she had very heavy menstrual cycle that caused her IUD to become dislodged and since then has had the symptoms off and on.  Nothing seems to make this better or worse.  She thinks is a little bit worse with eating a little bit worse if she exerts herself.  She denies cough congestion or fever.  Denies nausea vomiting or diarrhea.  She has had increased vaginal bleeding from her menstrual cycle is actually had to bleeding episodes this month.  Denies likelihood of being pregnant.  Denies urinary symptoms.  The history is provided by the patient.  Illness Severity:  Moderate Onset quality:  Gradual Duration:  12 months Timing:  Intermittent Progression:  Waxing and waning Chronicity:  New Associated symptoms: abdominal pain (nervousness in her abdomen), chest pain and shortness of breath   Associated symptoms: no congestion, no fever, no headaches, no myalgias, no nausea, no rhinorrhea, no vomiting and no wheezing        Past Medical History:  Diagnosis Date  . AMENORRHEA 04/04/2009   Qualifier: Diagnosis of  By: Delrae Alfred MD, Lanora Manis    . Anemia   . Biliary colic   . GERD (gastroesophageal reflux disease)   . Symptomatic cholelithiasis     Patient Active Problem List   Diagnosis Date Noted  . Myalgia 01/20/2019  . Morbid obesity (HCC) 09/29/2018    Past Surgical History:  Procedure Laterality Date  . CHOLECYSTECTOMY N/A 09/22/2017   Procedure: LAPAROSCOPIC CHOLECYSTECTOMY;  Surgeon: Henrene Dodge, MD;  Location: [facility] ORS;  Service: General;  Laterality: N/A;  . OVARIAN CYST REMOVAL       OB History   No obstetric  history on file.     Family History  Problem Relation Age of Onset  . Healthy Mother   . Healthy Father   . Cancer Neg Hx     Social History   Tobacco Use  . Smoking status: Never Smoker  . Smokeless tobacco: Never Used  Vaping Use  . Vaping Use: Never used  Substance Use Topics  . Alcohol use: No  . Drug use: No    Home Medications Prior to Admission medications   Medication Sig Start Date End Date Taking? Authorizing Provider  acetaminophen (TYLENOL) 500 MG tablet Take 500-1,000 mg by mouth every 8 (eight) hours as needed for mild pain.     [provider]  cyclobenzaprine (FLEXERIL) 5 MG tablet Take 1 tablet (5 mg total) by mouth at bedtime as needed for muscle spasms. Patient not taking: Reported on 07/15/2019 01/20/19   Newt Lukes, MD  fluticasone [facility]) 50 MCG/ACT nasal spray Place 2 sprays into both nostrils daily. Patient not taking: Reported on 10/20/2019 07/15/19   Claiborne Rigg, NP  ibuprofen (ADVIL,MOTRIN) 600 MG tablet Take 1 tablet (600 mg total) by mouth 2 (two) times daily as needed for moderate pain. 01/20/19   Newt Lukes, MD  pantoprazole (PROTONIX) 20 MG tablet Take 20 mg by mouth 2 (two) times daily as needed (nausea vomiting).    [provider]    Allergies  Patient has no known allergies.  Review of Systems   Review of Systems  Constitutional: Positive for chills. Negative for fever.       Increased hair loss   HENT: Negative for congestion and rhinorrhea.   Eyes: Negative for redness and visual disturbance.  Respiratory: Positive for shortness of breath. Negative for wheezing.   Cardiovascular: Positive for chest pain. Negative for palpitations.  Gastrointestinal: Positive for abdominal pain (nervousness in her abdomen). Negative for nausea and vomiting.  Endocrine: Positive for cold intolerance and heat intolerance.  Genitourinary: Positive for vaginal bleeding. Negative for dysuria and urgency.    Musculoskeletal: Negative for arthralgias and myalgias.  Skin: Negative for pallor and wound.  Neurological: Negative for dizziness and headaches.    Physical Exam Updated Vital Signs BP 127/84   Pulse 93   Temp 98.6 F (37 C) (Oral)   Resp 18   LMP 07/16/2020 (Exact Date)   SpO2 100%   Physical Exam Vitals and nursing note reviewed.  Constitutional:      General: She is not in acute distress.    Appearance: She is well-developed. She is obese. She is not diaphoretic.  HENT:     Head: Normocephalic and atraumatic.  Eyes:     Pupils: Pupils are equal, round, and reactive to light.  Cardiovascular:     Rate and Rhythm: Normal rate and regular rhythm.     Heart sounds: No murmur heard.  No friction rub. No gallop.   Pulmonary:     Effort: Pulmonary effort is normal.     Breath sounds: No wheezing or rales.  Abdominal:     General: There is no distension.     Palpations: Abdomen is soft.     Tenderness: There is no abdominal tenderness. There is no guarding.  Musculoskeletal:        General: No tenderness.     Cervical back: Normal range of motion and neck supple.  Skin:    General: Skin is warm and dry.  Neurological:     Mental Status: She is alert and oriented to person, place, and time.  Psychiatric:        Behavior: Behavior normal.     ED Results / Procedures / Treatments   Labs (all labs ordered are listed, but only abnormal results are displayed) Labs Reviewed  CBC WITH DIFFERENTIAL/PLATELET - Abnormal; Notable for the following components:      Result Value   MCV 78.7 (*)    MCH 23.9 (*)    All other components within normal limits  COMPREHENSIVE METABOLIC PANEL  TSH  I-STAT BETA HCG BLOOD, ED ([facility], [facility], [facility] ONLY)  I-STAT BETA HCG BLOOD, ED (NOT ORDERABLE)  TROPONIN I (HIGH SENSITIVITY)    EKG EKG Interpretation  Date/Time:  Saturday July 21 2020 12:31:55 EDT Ventricular Rate:  112 PR Interval:    QRS Duration: 90 QT Interval:  337 QTC  Calculation: 460 R Axis:   62 Text Interpretation: Sinus tachycardia No old tracing to compare Confirmed by Meridee Score [phone]) on 07/21/2020 1:32:13 PM   Radiology DG Chest 2 View  Result Date: 07/21/2020 CLINICAL DATA:  Palpitations. EXAM: CHEST - 2 VIEW COMPARISON:  None. FINDINGS: Lungs are somewhat hypoinflated and otherwise clear. Cardiomediastinal silhouette, bones and soft tissues are normal. IMPRESSION: No acute findings. Electronically Signed   By: Elberta Fortis M.D.   On: 07/21/2020 16:13    Procedures Procedures (including critical care time)  Medications Ordered in ED Medications  alum &  mag hydroxide-simeth (MAALOX/MYLANTA) 200-200-20 MG/5ML suspension 30 mL (30 mLs Oral Given 07/21/20 1839)    ED Course  I have reviewed the triage vital signs and the nursing notes.  Pertinent labs & imaging results that were available during my care of the patient were reviewed by me and considered in my medical decision making (see chart for details).    MDM Rules/Calculators/A&P                          39 yo F with a chief complaints of a nervous feeling in her stomach and chills.  This is been an off-and-on issue for her over the past year.  She has difficulty describing what makes it better or worse.  Seems to come on and off at random.  She thinks that she is having anxiety attacks was never had this diagnosed in the past.  We will obtain a laboratory evaluation.  She was tachycardic on arrival but not tachycardic on my exam.  Work-up is unremarkable. Tachycardia is resolved.  No significant anemia TSH is normal no significant electrolyte abnormality.  Discharge home.  7:05 PM:  I have discussed the diagnosis/risks/treatment options with the patient and believe the pt to be eligible for discharge home to follow-up with PCP. We also discussed returning to the ED immediately if new or worsening sx occur. We discussed the sx which are most concerning (e.g., sudden worsening pain,  fever, inability to tolerate by mouth) that necessitate immediate return. Medications administered to the patient during their visit and any new prescriptions provided to the patient are listed below.  Medications given during this visit Medications  alum & mag hydroxide-simeth (MAALOX/MYLANTA) 200-200-20 MG/5ML suspension 30 mL (30 mLs Oral Given 07/21/20 1839)     The patient appears reasonably screen and/or stabilized for discharge and I doubt any other medical condition or other [facility] requiring further screening, evaluation, or treatment in the ED at this time prior to discharge.     Final Clinical Impression(s) / ED Diagnoses Final diagnoses:  Epigastric discomfort  Chills  DUB (dysfunctional uterine bleeding)    Rx / DC Orders ED Discharge Orders    None       Melene Plan, DO 07/21/20 1906

## 2020-07-21 NOTE — ED Notes (Signed)
Floyd MD at bedside.  

## 2020-07-21 NOTE — ED Notes (Signed)
Called lab for status on TSH result

## 2020-07-21 NOTE — ED Notes (Signed)
Pt verbalizes understanding of DC instructions. Pt belongings returned and is ambulatory out of ED.  

## 2020-07-21 NOTE — Discharge Instructions (Signed)
Try pepcid or tagamet up to twice a day.  Try to avoid things that may make this worse, most commonly these are spicy foods tomato based products fatty foods chocolate and peppermint.  Alcohol and tobacco can also make this worse.  Return to the emergency department for sudden worsening pain fever or inability to eat or drink.  

## 2020-07-25 ENCOUNTER — Other Ambulatory Visit: Payer: Self-pay

## 2020-07-25 ENCOUNTER — Ambulatory Visit: Payer: Self-pay | Attending: Physician Assistant | Admitting: Physician Assistant

## 2020-07-25 DIAGNOSIS — F419 Anxiety disorder, unspecified: Secondary | ICD-10-CM

## 2020-07-25 DIAGNOSIS — Z09 Encounter for follow-up examination after completed treatment for conditions other than malignant neoplasm: Secondary | ICD-10-CM

## 2020-07-25 DIAGNOSIS — Z789 Other specified health status: Secondary | ICD-10-CM

## 2020-07-25 DIAGNOSIS — R1013 Epigastric pain: Secondary | ICD-10-CM

## 2020-07-25 MED ORDER — HYDROXYZINE PAMOATE 25 MG PO CAPS
ORAL_CAPSULE | ORAL | 2 refills | Status: DC
Start: 2020-07-25 — End: 2022-05-09

## 2020-07-25 MED FILL — hydrOXYzine PAMOATE 25 MG C: 25 | 10 days supply | Qty: 30 | Fill #0

## 2020-07-25 NOTE — Progress Notes (Signed)
Virtual Visit via Telephone Note  I connected with [name] on 07/25/20 at  2:50 PM EDT by telephone and verified that I am speaking with the correct person using two identifiers.   I discussed the limitations, risks, security and privacy concerns of performing an evaluation and management service by telephone and the availability of in person appointments. I also discussed with the patient that there may be a patient responsible charge related to this service. The patient expressed understanding and agreed to proceed.   PATIENT visit by telephone virtually in the context of Covid-19 pandemic. Patient location:  home My Location:  [facility] office Persons on the call:  Me, interpreter(Luis), and the patient    History of Present Illness:  Seen in the ED 07/21/2020 for stomach issues. Burning in stomach and pain in stomach esp after meals.  Taking an OTC reflux medication which seems to be helping some.  No vomiting.  She doesn't know the name of the medication. Exacerbated by overeating and spicy foods.  No diarrhea.  No blood in stools  She is also having some anxiety attacks when she has the stomach pain.  Both of these issues have been > 1 year.      From ED note: 39 yo F with a chief complaints of a nervous feeling in her stomach and chills.  This is been an off-and-on issue for her over the past year.  She has difficulty describing what makes it better or worse.  Seems to come on and off at random.  She thinks that she is having anxiety attacks was never had this diagnosed in the past.  We will obtain a laboratory evaluation.  She was tachycardic on arrival but not tachycardic on my exam.  Work-up is unremarkable. Tachycardia is resolved.  No significant anemia TSH is normal no significant electrolyte abnormality.  Discharge home.  7:05 PM:  I have discussed the diagnosis/risks/treatment options with the patient and believe the pt to be eligible for discharge home to  follow-up with PCP. We also discussed returning to the ED immediately if new or worsening sx occur. We discussed the sx which are most concerning (e.g., sudden worsening pain, fever, inability to tolerate by mouth) that necessitate immediate return. Medications administered to the patient during their visit and any new prescriptions provided to the patient are listed below.    Observations/Objective:  NAD.  A&Ox3   Assessment and Plan: 1. Dyspepsia Continue OTC med she was recommended in ED as it seems to be helping.  Frequent, small meals, avoid overeating.   - H. pylori breath test; Future  2. Anxiety Can try hydroxyzine.  Consider SSRI with PCP if persistent - Vitamin D, 25-hydroxy; Future  3. Language barrier pacific interpreters used and additional time performing visit was required.   4. Encounter for examination following treatment at hospital improving    Follow Up Instructions: See PCP in 6 weeks, labs 9am friday   I discussed the assessment and treatment plan with the patient. The patient was provided an opportunity to ask questions and all were answered. The patient agreed with the plan and demonstrated an understanding of the instructions.   The patient was advised to call back or seek an in-person evaluation if the symptoms worsen or if the condition fails to improve as anticipated.  I provided 16 minutes of non-face-to-face time during this encounter.   Georgian Co, PA-C  Patient ID: [name], female   DOB: 05/20/1981, 39 y.o.   MRN: [id number]

## 2020-07-27 NOTE — Addendum Note (Signed)
Addended byMemory Dance on: 07/27/2020 08:37 AM   Modules accepted: Orders

## 2020-07-28 LAB — VITAMIN D 25 HYDROXY (VIT D DEFICIENCY, FRACTURES): Vit D, 25-Hydroxy: 24.1 ng/mL — ABNORMAL LOW (ref 30.0–100.0)

## 2020-07-29 LAB — H. PYLORI BREATH TEST: H pylori Breath Test: POSITIVE — AB

## 2020-08-01 ENCOUNTER — Other Ambulatory Visit: Payer: Self-pay | Admitting: Physician Assistant

## 2020-08-01 DIAGNOSIS — A048 Other specified bacterial intestinal infections: Secondary | ICD-10-CM

## 2020-08-01 MED ORDER — OMEPRAZOLE 20 MG PO CPDR: 20.0000 mg | DELAYED_RELEASE_CAPSULE | Freq: Two times a day (BID) | ORAL | 1 refills | Status: DC

## 2020-08-01 MED ORDER — AMOXICILLIN 500 MG PO CAPS: 1000.0000 mg | ORAL_CAPSULE | Freq: Two times a day (BID) | ORAL | 0 refills | Status: DC

## 2020-08-01 MED ORDER — FLUCONAZOLE 150 MG PO TABS: 150.0000 mg | ORAL_TABLET | Freq: Once | ORAL | 0 refills | Status: AC

## 2020-08-01 MED ORDER — CLARITHROMYCIN 500 MG PO TABS: 500.0000 mg | ORAL_TABLET | Freq: Two times a day (BID) | ORAL | 0 refills | Status: DC

## 2020-08-01 MED FILL — CLARITHROMYCIN 500 MG TAB: 500 | 14 days supply | Qty: 28 | Fill #0

## 2020-08-01 MED FILL — AMOXICILLIN 500 MG CAPSULE: 500 | 13 days supply | Qty: 54 | Fill #0

## 2020-08-01 MED FILL — OMEPRAZOLE 20 MG CAP: 20 | 15 days supply | Qty: 30 | Fill #0

## 2020-08-01 MED FILL — FLUCONAZOLE 150 MG TABLET: 150 | 1 days supply | Qty: 1 | Fill #0

## 2020-08-03 ENCOUNTER — Telehealth: Payer: Self-pay | Admitting: Physician Assistant

## 2020-08-03 NOTE — Telephone Encounter (Signed)
Pt called stating that she received a medication that she is not sure how to use and is requesting to have a call back to go over the instructions. Please advise.

## 2020-08-03 NOTE — Telephone Encounter (Signed)
Call returned. Instructed patient on how to take her abx for H pylori infection. All questions and concerns addressed.

## 2020-08-09 ENCOUNTER — Other Ambulatory Visit: Payer: Self-pay

## 2020-08-09 ENCOUNTER — Ambulatory Visit: Payer: Self-pay | Attending: Nurse Practitioner

## 2020-08-22 ENCOUNTER — Telehealth: Payer: Self-pay | Admitting: Nurse Practitioner

## 2020-08-22 NOTE — Telephone Encounter (Signed)
Pt was sent a letter from financial dept. Inform them, that the application they submitted was incomplete, since they were missing some documentation at the time of the appointment, Pt need to reschedule and resubmit all new papers and application for [employer] and [employer], P.S. old documents has been sent back by mail to the Pt and Pt. need to make a new appt. 

## 2021-12-07 ENCOUNTER — Ambulatory Visit (HOSPITAL_COMMUNITY)
Admission: EM | Admit: 2021-12-07 | Discharge: 2021-12-07 | Disposition: A | Discharge status: Home / Self Care | Payer: Self-pay | Attending: Medical Oncology | Admitting: Medical Oncology

## 2021-12-07 ENCOUNTER — Other Ambulatory Visit: Payer: Self-pay

## 2021-12-07 ENCOUNTER — Encounter (HOSPITAL_COMMUNITY): Payer: Self-pay

## 2021-12-07 DIAGNOSIS — R079 Chest pain, unspecified: Secondary | ICD-10-CM

## 2021-12-07 DIAGNOSIS — K219 Gastro-esophageal reflux disease without esophagitis: Secondary | ICD-10-CM

## 2021-12-07 DIAGNOSIS — Z8619 Personal history of other infectious and parasitic diseases: Secondary | ICD-10-CM

## 2021-12-07 MED ORDER — LIDOCAINE VISCOUS HCL 2 % MT SOLN
15.0000 mL | Freq: Once | OROMUCOSAL | Status: AC
  Administered 2021-12-07: 15 mL via ORAL

## 2021-12-07 MED ORDER — ALUM & MAG HYDROXIDE-SIMETH 200-200-20 MG/5ML PO SUSP
30.0000 mL | Freq: Once | ORAL | Status: AC
  Administered 2021-12-07: 30 mL via ORAL

## 2021-12-07 MED ORDER — OMEPRAZOLE 20 MG PO CPDR
20.0000 mg | DELAYED_RELEASE_CAPSULE | Freq: Every day | ORAL | 0 refills | Status: DC
Start: 2021-12-07 — End: 2022-01-02

## 2021-12-07 MED ORDER — LIDOCAINE VISCOUS HCL 2 % MT SOLN
OROMUCOSAL | Status: AC
  Filled 2021-12-07: qty 15

## 2021-12-07 MED ORDER — ALUM & MAG HYDROXIDE-SIMETH 200-200-20 MG/5ML PO SUSP
ORAL | Status: AC
  Filled 2021-12-07: qty 30

## 2021-12-07 NOTE — ED Triage Notes (Signed)
Pt c/o dizziness, headache, nausea, SOB x1day. Pt states that she was seen for the same symptoms 3 years ago and they have returned today. Pt took Tylenol around 1pm.  Pt states that the pain in her chest feels like someone is pushing down on her chest. Pt states that she has a history of high blood pressure and is not on Blood pressure medication. Blood pressure today was 180/98.  Pt is with her niece.   Used interpretor [phone]

## 2021-12-07 NOTE — ED Provider Notes (Signed)
[facility]    CSN: [id number] Arrival date & time: 12/07/21  1534      History   Chief Complaint Chief Complaint  Patient presents with   Dizziness   Nausea    HPI [name] is a 40 y.o. female. [name] interpretor helped with our visit   HPI  Nausea: Patient reports that she has had some nausea, chest pressure, esophageal burning sensation, scant shortness of breath and a bad taste in her mouth for the past few weeks but worse today.  She states that 1 year ago she was diagnosed with H. pylori which gave her almost identical symptoms and this was treated.  She states that this helped her symptoms resolved but they have returned.  She states that she had a little bit of dizziness and a little bit of headache when symptoms first returned and she felt very nervous about her chest pain.  Blood pressure was elevated at this time but has improved.  No history of hypertension or cardiac disease.  She states that other than the burning in her esophagus and mild chest pressure she feels much better.  No fevers, vomiting or diarrhea.  No abdominal pain.  She has not tried anything for symptoms.  Past Medical History:  Diagnosis Date   AMENORRHEA 04/04/2009   Qualifier: Diagnosis of  By: Delrae Alfred MD, Lanora Manis     Anemia    Biliary colic    GERD (gastroesophageal reflux disease)    Symptomatic cholelithiasis     Patient Active Problem List   Diagnosis Date Noted   Myalgia 01/20/2019   Morbid obesity (HCC) 09/29/2018    Past Surgical History:  Procedure Laterality Date   CHOLECYSTECTOMY N/A 09/22/2017   Procedure: LAPAROSCOPIC CHOLECYSTECTOMY;  Surgeon: Henrene Dodge, MD;  Location: [facility] ORS;  Service: General;  Laterality: N/A;   OVARIAN CYST REMOVAL      OB History   No obstetric history on file.      Home Medications    Prior to Admission medications   Medication Sig Start Date End Date Taking? Authorizing Provider  acetaminophen (TYLENOL) 500 MG  tablet Take 500-1,000 mg by mouth every 8 (eight) hours as needed for mild pain.   Yes [provider]  ibuprofen (ADVIL,MOTRIN) 600 MG tablet Take 1 tablet (600 mg total) by mouth 2 (two) times daily as needed for moderate pain. 01/20/19  Yes Newt Lukes, MD  amoxicillin (AMOXIL) 500 MG capsule Take 2 capsules (1,000 mg total) by mouth 2 (two) times daily. 08/01/20   Anders Simmonds, PA-C  clarithromycin (BIAXIN) 500 MG tablet Take 1 tablet (500 mg total) by mouth 2 (two) times daily. 08/01/20   Anders Simmonds, PA-C  cyclobenzaprine (FLEXERIL) 5 MG tablet Take 1 tablet (5 mg total) by mouth at bedtime as needed for muscle spasms. Patient not taking: Reported on 07/15/2019 01/20/19   Newt Lukes, MD  fluticasone [facility]) 50 MCG/ACT nasal spray Place 2 sprays into both nostrils daily. Patient not taking: Reported on 10/20/2019 07/15/19   Claiborne Rigg, NP  hydrOXYzine (VISTARIL) 25 MG capsule 1/2 to 1 tid prn anxiety 07/25/20   Anders Simmonds, PA-C  omeprazole (PRILOSEC) 20 MG capsule Take 1 capsule (20 mg total) by mouth 2 (two) times daily before a meal. 08/01/20   McClung, Marzella Schlein, PA-C  pantoprazole (PROTONIX) 20 MG tablet Take 20 mg by mouth 2 (two) times daily as needed (nausea vomiting). Patient not taking: Reported on 07/25/2020  [provider]    Family History Family History  Problem Relation Age of Onset   Healthy Mother    Healthy Father    Cancer Neg Hx     Social History Social History   Tobacco Use   Smoking status: Never   Smokeless tobacco: Never  Vaping Use   Vaping Use: Never used  Substance Use Topics   Alcohol use: No   Drug use: No     Allergies   Patient has no known allergies.   Review of Systems Review of Systems  As stated above in HPI Physical Exam Triage Vital Signs ED Triage Vitals  Enc Vitals Group     BP 12/07/21 1725 (!) 154/98     Pulse Rate 12/07/21 1725 (!) 104     Resp 12/07/21 1725 18     Temp  12/07/21 1725 98.6 F (37 C)     Temp Source 12/07/21 1725 Oral     SpO2 12/07/21 1725 98 %     Weight 12/07/21 1720 198 lb (89.8 kg)     Height --      Head Circumference --      Peak Flow --      Pain Score 12/07/21 1716 9     Pain Loc --      Pain Edu? --      Excl. in [place]? --    No data found.  Updated Vital Signs BP (!) 154/98 (BP Location: Left Arm)   Pulse (!) 104   Temp 98.6 F (37 C) (Oral)   Resp 18   Wt 198 lb (89.8 kg)   LMP 11/07/2021   SpO2 98%   BMI 35.07 kg/m   Physical Exam Vitals and nursing note reviewed.  Constitutional:      General: She is not in acute distress.    Appearance: Normal appearance. She is obese. She is not ill-appearing or diaphoretic.  HENT:     Head: Normocephalic and atraumatic.     Right Ear: Tympanic membrane normal.     Left Ear: Tympanic membrane normal.     Nose: Nose normal.     Mouth/Throat:     Mouth: Mucous membranes are moist.     Pharynx: Oropharynx is clear. No oropharyngeal exudate or posterior oropharyngeal erythema.  Eyes:     Extraocular Movements: Extraocular movements intact.     Pupils: Pupils are equal, round, and reactive to light.  Cardiovascular:     Rate and Rhythm: Normal rate and regular rhythm.     Heart sounds: Normal heart sounds.  Pulmonary:     Effort: Pulmonary effort is normal.     Breath sounds: Normal breath sounds.  Abdominal:     General: Bowel sounds are normal.     Palpations: Abdomen is soft.  Musculoskeletal:     Cervical back: Normal range of motion and neck supple.  Lymphadenopathy:     Cervical: No cervical adenopathy.  Skin:    General: Skin is warm.  Neurological:     Mental Status: She is alert and oriented to person, place, and time.     UC Treatments / Results  Labs (all labs ordered are listed, but only abnormal results are displayed) Labs Reviewed - No data to display  EKG   Radiology No results found.  Procedures Procedures (including critical care  time)  Medications Ordered in UC Medications - No data to display  Initial Impression / Assessment and Plan / UC Course  I have  reviewed the triage vital signs and the nursing notes.  Pertinent labs & imaging results that were available during my care of the patient were reviewed by me and considered in my medical decision making (see chart for details).     New.  GI cocktail fully resolved symptoms.  Blood pressure continue to improve.  I want her to follow-up with her PCP as I do suspect that she might have H. pylori though this does need to be confirmed especially since she has had this previously to make sure that this is not a strain that is resistant.  I Minna start her on omeprazole which she will need to stop for 2 weeks before testing.  She should also follow a GERD and low-salt diet to help with her blood pressure and symptoms.  Discussed red flag signs and symptoms.  Follow-up as needed. Final Clinical Impressions(s) / UC Diagnoses   Final diagnoses:  None   Discharge Instructions   None    ED Prescriptions   None    PDMP not reviewed this encounter.   Hughie Closs, [place] 12/07/21 1856

## 2021-12-10 ENCOUNTER — Telehealth (INDEPENDENT_AMBULATORY_CARE_PROVIDER_SITE_OTHER): Payer: Self-pay | Admitting: Nurse Practitioner

## 2021-12-10 ENCOUNTER — Other Ambulatory Visit: Payer: Self-pay

## 2021-12-10 ENCOUNTER — Encounter: Payer: Self-pay | Admitting: Nurse Practitioner

## 2021-12-10 DIAGNOSIS — Z8719 Personal history of other diseases of the digestive system: Secondary | ICD-10-CM

## 2021-12-10 DIAGNOSIS — R1013 Epigastric pain: Secondary | ICD-10-CM

## 2021-12-10 NOTE — Patient Instructions (Signed)
Dyspepsia:  History of H. Pylori Will place referral to GI for further evaluation and treatment  Continue current medications  Stay well hydrated  Follow up:  Follow up with PCP if needed

## 2021-12-10 NOTE — Progress Notes (Signed)
Virtual Visit via Telephone Note  I connected with [name] on 12/10/21 at  9:00 AM EST by telephone and verified that I am speaking with the correct person using two identifiers.  Location: Patient: home Provider: office   I discussed the limitations, risks, security and privacy concerns of performing an evaluation and management service by telephone and the availability of in person appointments. I also discussed with the patient that there may be a patient responsible charge related to this service. The patient expressed understanding and agreed to proceed.   History of Present Illness:  Patient presents today for GI issues. This is a tele-visit.  Patient was seen in the ED on 12/07/2021 for this issue.  She had reported nausea chest pressure and esophageal burning sensation with slight shortness of breath at times.  Patient did test positive for H. pylori 1 year ago and was treated she states that her symptoms did resolve but feels like she is having the same symptoms now.  Patient was given GI cocktail in ED and symptoms fully resolved.  Patient was started on omeprazole and she states this has slightly improved her symptoms.  We discussed that we will place a referral for GI for her for further testing and evaluation.  She may need further treatment for H. pylori. Denies f/c/s, n/v/d, hemoptysis, PND, chest pain or edema.     Observations/Objective:  Vitals with BMI 12/07/2021 12/07/2021 12/07/2021  Height - - -  Weight - - 198 lbs  BMI - - -  Systolic 142 154 -  Diastolic 92 98 -  Pulse - 104 -      Assessment and Plan:  Dyspepsia:  History of H. Pylori Will place referral to GI for further evaluation and treatment  Continue current medications  Stay well hydrated  Follow up:  Follow up with PCP if needed    I discussed the assessment and treatment plan with the patient. The patient was provided an opportunity to ask questions and all were answered.  The patient agreed with the plan and demonstrated an understanding of the instructions.   The patient was advised to call back or seek an in-person evaluation if the symptoms worsen or if the condition fails to improve as anticipated.  I provided 23 minutes of non-face-to-face time during this encounter.   [name], NP

## 2022-01-01 NOTE — Progress Notes (Signed)
Patient ID: [name], female   DOB: 18-Jul-1981, 41 y.o.   MRN: [id number]    [name], is a 41 y.o. female  LMB:[id number]  [id number]  DOB - Nov 14, 1981  Chief Complaint  Patient presents with   Follow-up       Subjective:   [name] is a 41 y.o. female here today for a follow up visit ED 12/07/2021.  She had h. Pylori that was treated about 1 year ago.  She had done great until about 1 month ago when it seems it came back.  Same dyspesia and bloating after meals with acid reflux.  No melena or hematochezia.  No labs in 1.5 yrs.  HPI   Nausea: Patient reports that she has had some nausea, chest pressure, esophageal burning sensation, scant shortness of breath and a bad taste in her mouth for the past few weeks but worse today.  She states that 1 year ago she was diagnosed with H. pylori which gave her almost identical symptoms and this was treated.  She states that this helped her symptoms resolved but they have returned.  She states that she had a little bit of dizziness and a little bit of headache when symptoms first returned and she felt very nervous about her chest pain.  Blood pressure was elevated at this time but has improved.  No history of hypertension or cardiac disease.  She states that other than the burning in her esophagus and mild chest pressure she feels much better.  No fevers, vomiting or diarrhea.  No abdominal pain.  She has not tried anything for symptoms.  From ED A/P: New.  GI cocktail fully resolved symptoms.  Blood pressure continue to improve.  I want her to follow-up with her PCP as I do suspect that she might have H. pylori though this does need to be confirmed especially since she has had this previously to make sure that this is not a strain that is resistant.  I Minna start her on omeprazole which she will need to stop for 2 weeks before testing.  She should also follow a GERD and low-salt diet to help with her blood  pressure and symptoms.  Discussed red flag signs and symptoms.  Follow-up as needed.    Patient has No headache, No chest pain, No abdominal pain - No Nausea, No new weakness tingling or numbness, No Cough - SOB.  No problems updated.  ALLERGIES: No Known Allergies  PAST MEDICAL HISTORY: Past Medical History:  Diagnosis Date   AMENORRHEA 04/04/2009   Qualifier: Diagnosis of  By: Delrae Alfred MD, Lanora Manis     Anemia    Biliary colic    GERD (gastroesophageal reflux disease)    Symptomatic cholelithiasis     MEDICATIONS AT HOME: Prior to Admission medications   Medication Sig Start Date End Date Taking? Authorizing Provider  omeprazole (PRILOSEC) 40 MG capsule Take 1 capsule (40 mg total) by mouth daily. 01/02/22  Yes Anders Simmonds, PA-C  acetaminophen (TYLENOL) 500 MG tablet Take 500-1,000 mg by mouth every 8 (eight) hours as needed for mild pain. Patient not taking: Reported on 01/02/2022    [provider]  amoxicillin (AMOXIL) 500 MG capsule Take 2 capsules (1,000 mg total) by mouth 2 (two) times daily. Patient not taking: Reported on 01/02/2022 08/01/20   Anders Simmonds, PA-C  cyclobenzaprine (FLEXERIL) 5 MG tablet Take 1 tablet (5 mg total) by mouth at bedtime as needed for muscle spasms. Patient not taking: Reported on  07/15/2019 01/20/19   Newt Lukes, MD  fluticasone (FLONASE) 50 MCG/ACT nasal spray Place 2 sprays into both nostrils daily. Patient not taking: Reported on 10/20/2019 07/15/19   Claiborne Rigg, NP  hydrOXYzine (VISTARIL) 25 MG capsule 1/2 to 1 tid prn anxiety Patient not taking: Reported on 01/02/2022 07/25/20   Anders Simmonds, PA-C    ROS: Neg HEENT Neg resp Neg cardiac Neg GU Neg MS Neg psych Neg neuro  Objective:   Vitals:   01/02/22 1055  BP: 137/87  Pulse: (!) 101  SpO2: 97%  Weight: 193 lb 2 oz (87.6 kg)  Height: 5\' 3"  (1.6 m)   Exam General appearance : Awake, alert, not in any distress. Speech Clear. Not toxic  looking HEENT: Atraumatic and Normocephalic Neck: Supple, no JVD. No cervical lymphadenopathy.  Chest: Good air entry bilaterally, CTAB.  No rales/rhonchi/wheezing CVS: S1 S2 regular, no murmurs.  Abdomen: Bowel sounds present, Non tender and not distended with no gaurding, rigidity or rebound. Extremities: B/L Lower Ext shows no edema, both legs are warm to touch Neurology: Awake alert, and oriented X 3, CN II-XII intact, Non focal Skin: No Rash  Data Review No results found for: HGBA1C  Assessment & Plan   1. Dyspepsia Non-acute abdomen - Ambulatory referral to Gastroenterology - Comprehensive metabolic panel - CBC with Differential/Platelet - omeprazole (PRILOSEC) 40 MG capsule; Take 1 capsule (40 mg total) by mouth daily.  Dispense: 90 capsule; Refill: 1  2. Language barrier [employer] "Frederico" interpreters used and additional time performing visit was required.   3. Encounter for examination following treatment at hospital    Patient have been counseled extensively about nutrition and exercise. Other issues discussed during this visit include: low cholesterol diet, weight control and daily exercise, foot care, annual eye examinations at Ophthalmology, importance of adherence with medications and regular follow-up. We also discussed long term complications of uncontrolled diabetes and hypertension.   Return if symptoms worsen or fail to improve.  The patient was given clear instructions to go to ER or return to medical center if symptoms don't improve, worsen or new problems develop. The patient verbalized understanding. The patient was told to call to get lab results if they haven't heard anything in the next week.      , PA-C [facility] and [facility] [place], [place]   01/02/2022, 11:19 AM

## 2022-01-02 ENCOUNTER — Ambulatory Visit: Payer: Self-pay | Attending: Physician Assistant | Admitting: Physician Assistant

## 2022-01-02 ENCOUNTER — Other Ambulatory Visit: Payer: Self-pay

## 2022-01-02 ENCOUNTER — Encounter: Payer: Self-pay | Admitting: Physician Assistant

## 2022-01-02 VITALS — BP 137/87 | HR 101 | Ht 63.0 in | Wt 193.1 lb

## 2022-01-02 DIAGNOSIS — Z09 Encounter for follow-up examination after completed treatment for conditions other than malignant neoplasm: Secondary | ICD-10-CM

## 2022-01-02 DIAGNOSIS — Z789 Other specified health status: Secondary | ICD-10-CM

## 2022-01-02 DIAGNOSIS — R1013 Epigastric pain: Secondary | ICD-10-CM

## 2022-01-02 MED ORDER — OMEPRAZOLE 40 MG PO CPDR: 40.0000 mg | DELAYED_RELEASE_CAPSULE | Freq: Every day | ORAL | 1 refills | Status: DC

## 2022-01-03 LAB — CBC WITH DIFFERENTIAL/PLATELET
Basophils Absolute: 0.1 10*3/uL (ref 0.0–0.2)
Basos: 1 %
EOS (ABSOLUTE): 0.1 10*3/uL (ref 0.0–0.4)
Eos: 2 %
Hematocrit: 37.3 % (ref 34.0–46.6)
Hemoglobin: 11.4 g/dL (ref 11.1–15.9)
Immature Grans (Abs): 0 10*3/uL (ref 0.0–0.1)
Immature Granulocytes: 0 %
Lymphocytes Absolute: 1.8 10*3/uL (ref 0.7–3.1)
Lymphs: 22 %
MCH: 20.5 pg — ABNORMAL LOW (ref 26.6–33.0)
MCHC: 30.6 g/dL — ABNORMAL LOW (ref 31.5–35.7)
MCV: 67 fL — ABNORMAL LOW (ref 79–97)
Monocytes Absolute: 0.5 10*3/uL (ref 0.1–0.9)
Monocytes: 6 %
Neutrophils Absolute: 5.7 10*3/uL (ref 1.4–7.0)
Neutrophils: 69 %
Platelets: 477 10*3/uL — ABNORMAL HIGH (ref 150–450)
RBC: 5.55 x10E6/uL — ABNORMAL HIGH (ref 3.77–5.28)
RDW: 18.7 % — ABNORMAL HIGH (ref 11.7–15.4)
WBC: 8.2 10*3/uL (ref 3.4–10.8)

## 2022-01-03 LAB — COMPREHENSIVE METABOLIC PANEL
ALT: 20 IU/L (ref 0–32)
AST: 18 IU/L (ref 0–40)
Albumin/Globulin Ratio: 1.2 (ref 1.2–2.2)
Albumin: 4.2 g/dL (ref 3.8–4.8)
Alkaline Phosphatase: 72 IU/L (ref 44–121)
BUN/Creatinine Ratio: 16 (ref 9–23)
BUN: 10 mg/dL (ref 6–20)
Bilirubin Total: 0.2 mg/dL (ref 0.0–1.2)
CO2: 24 mmol/L (ref 20–29)
Calcium: 9.1 mg/dL (ref 8.7–10.2)
Chloride: 101 mmol/L (ref 96–106)
Creatinine, Ser: 0.62 mg/dL (ref 0.57–1.00)
Globulin, Total: 3.4 g/dL (ref 1.5–4.5)
Glucose: 95 mg/dL (ref 70–99)
Potassium: 4.9 mmol/L (ref 3.5–5.2)
Sodium: 138 mmol/L (ref 134–144)
Total Protein: 7.6 g/dL (ref 6.0–8.5)
eGFR: 116 mL/min/{1.73_m2} (ref >59)

## 2022-02-09 ENCOUNTER — Encounter: Payer: Self-pay | Admitting: Physician Assistant

## 2022-02-28 ENCOUNTER — Ambulatory Visit: Payer: Self-pay | Attending: Nurse Practitioner

## 2022-02-28 ENCOUNTER — Other Ambulatory Visit: Payer: Self-pay

## 2022-03-13 ENCOUNTER — Telehealth: Payer: Self-pay | Admitting: Nurse Practitioner

## 2022-03-13 NOTE — Telephone Encounter (Signed)
I return Pt call, she was inform that all the documents has been received and ws send to cone to be review at this time ?

## 2022-03-13 NOTE — Telephone Encounter (Signed)
Copied from [place] [phone]. Topic: General - Other ?>> Mar 13, 2022 12:14 PM McGill, Nelva Bush wrote: ?Reason for CRM: Pt stated she wanted to follow up with [facility]. Pt stated she has sent some documents and is unsure if the documents sent are correct.? ?Pt stated [facility] gave her until March 20th to have in. ? ?Pt requesting a call back. ?

## 2022-04-07 ENCOUNTER — Encounter: Payer: Self-pay | Admitting: Nurse Practitioner

## 2022-04-14 ENCOUNTER — Other Ambulatory Visit: Payer: Self-pay | Admitting: Nurse Practitioner

## 2022-04-14 ENCOUNTER — Telehealth: Payer: Self-pay | Admitting: Nurse Practitioner

## 2022-04-14 DIAGNOSIS — K089 Disorder of teeth and supporting structures, unspecified: Secondary | ICD-10-CM

## 2022-04-14 NOTE — Telephone Encounter (Signed)
OC valid until 02/28/22 - 08/31/22.  ?Please send dental referral.  ?

## 2022-04-14 NOTE — Telephone Encounter (Signed)
Copied from [place] [phone]. Topic: General - Inquiry ?>> Apr 14, 2022  2:59 PM Alanda Slim E wrote: ?Reason for CRM: Pt wants to know if she can still go to the dentist or if she needs a referral and if she can use her orange card to be covered /pt doesn't know the clinic or number of the clinic for dental / please advise ?

## 2022-05-08 ENCOUNTER — Ambulatory Visit
Admission: RE | Admit: 2022-05-08 | Discharge: 2022-05-08 | Discharge status: Absent without leave | Payer: Self-pay | Source: Ambulatory Visit | Attending: Nurse Practitioner | Admitting: Nurse Practitioner

## 2022-05-09 ENCOUNTER — Encounter: Payer: Self-pay | Admitting: Nurse Practitioner

## 2022-05-09 ENCOUNTER — Ambulatory Visit (INDEPENDENT_AMBULATORY_CARE_PROVIDER_SITE_OTHER): Payer: Self-pay | Admitting: Nurse Practitioner

## 2022-05-09 VITALS — BP 122/88 | HR 100 | Ht 62.0 in | Wt 192.0 lb

## 2022-05-09 DIAGNOSIS — K219 Gastro-esophageal reflux disease without esophagitis: Secondary | ICD-10-CM

## 2022-05-09 DIAGNOSIS — R110 Nausea: Secondary | ICD-10-CM

## 2022-05-09 DIAGNOSIS — A048 Other specified bacterial intestinal infections: Secondary | ICD-10-CM

## 2022-05-09 NOTE — Patient Instructions (Signed)
Ha sido programado para un EGD. Siga las instrucciones escritas que se le dieron en su visita de hoy. ?Si [place] inhaladores (aunque solo sea necesario), tr?igalos el d?a de su procedimiento. ? ?Comience a tomar Pepcid 20 MG a la hora de acostarse seg?n sea necesario. Esto es de [address]. ? ?Contin?e tomando Omeprazol 40 MG todos los d?as. ? ?Enfermedad de reflujo gastroesof?gico en los adultos ?Gastroesophageal Reflux Disease, Adult ? ?El reflujo gastroesof?gico (RGE) ocurre cuando el ?cido del est?mago sube por el tubo que conecta la boca con el est?mago (es?fago). Normalmente, la comida baja por el es?fago y se mantiene en el est?mago, donde se la digiere. Cuando una persona tiene RGE, los alimentos y el ?cido estomacal suelen volver al es?fago. ?Usted puede tener una enfermedad llamada enfermedad de reflujo gastroesof?gico (ERGE) si el reflujo: ?Sucede a menudo. ?Causa s?ntomas frecuentes o muy intensos. ?Causa problemas tales como da?o en el es?fago. ?Cuando esto ocurre, el es?fago duele y se hincha. Con el tiempo, la ERGE puede ocasionar peque?os agujeros (?lceras) en el revestimiento del es?fago. ??Cu?les son las causas? ?Esta afecci?n se debe a un problema en el m?sculo que se encuentra entre el es?fago y el est?mago. Cuando este m?sculo est? d?bil o no es normal, no se cierra correctamente para impedir que los alimentos y el ?cido regresen del est?mago. ?El m?sculo puede debilitarse debido a lo siguiente: ?El consumo de tabaco. ?Psychiatrist. ?Tener cierto tipo de hernia (hernia de hiato). ?Consumo de alcohol. ?Ciertos alimentos y bebidas, como caf?, chocolate, cebollas y [place]. ??Qu? incrementa el riesgo? ?Tener sobrepeso. ?Tener una enfermedad que afecta el tejido conjuntivo. ?Tomar antiinflamatorios no esteroideos (AINE), como el ibuprofeno. ??Cu?les son los signos o s?ntomas? ?Acidez estomacal. ?Dificultad o dolor al tragar. ?Sensaci?n de tener un bulto en la garganta. ?Sabor amargo en la boca. ?Mal  aliento. ?Tener una gran cantidad de saliva. ?Est?mago inflamado o con Dentist. ?Eructos. ?Journalist, newspaper. El dolor de pecho puede deberse a distintas afecciones. Aseg?rese de consultar a su m?dico si tiene Journalist, newspaper. ?Dificultad para respirar o sibilancias. ?Neomia Dear tos a largo plazo o tos nocturna. ?Desgaste de la superficie de los dientes (esmalte dental). ?P?rdida de peso. ??C?mo se trata? ?Realizar cambios en la dieta. ?Tomar medicamentos. ?Someterse a Dealer. ?El tratamiento depender? de la gravedad de los s?ntomas. ?Siga estas instrucciones en su casa: ?Comida y bebida ? ?Siga una dieta como se lo haya indicado el m?dico. Es posible que deba evitar alimentos y bebidas, por ejemplo: ?Caf? y t? negro, con o sin cafe?na. ?Bebidas que contengan alcohol. ?Bebidas energ?ticas y deportivas. ?Bebidas gaseosas (carbonatadas) y refrescos. ?Chocolate y cacao. ?Menta y esencia de [place]. ?Ajo y cebolla. ?R?bano picante. ?Alimentos ?cidos y condimentados. Estos incluyen todos los tipos de pimientos, [place] en polvo, curry en polvo, vinagre, salsas picantes y [employer]. ?C?tricos y sus jugos, por ejemplo, naranjas, limones y limas. ?Alimentos que [employer]. Estos incluyen salsa roja, [place], salsa picante y pizza con salsa de [place]. ?Alimentos fritos y Lexicographer. Estos incluyen donas, papas fritas, papitas fritas de bolsa y aderezos con alto contenido de [place] ?Carnes con alto contenido de [place] Estas incluye los perros calientes, chuletas o costillas, embutidos, jam?n y tocino. ?Productos l?cteos ricos en grasas, como leche [place], [place] y [place] crema. ?Consuma peque?as cantidades de comida con m?s frecuencia. Evite consumir porciones abundantes. ?Evite beber grandes cantidades de l?quidos con las comidas. ?Evite comer 2 o 3 horas antes de acostarse. ?Evite recostarse inmediatamente despu?s de comer. ?  No haga ejercicios enseguida despu?s de comer. ?Estilo de vida ? ?No fume ni consuma ning?n  producto que contenga nicotina o tabaco. Si necesita ayuda para dejar de consumir estos productos, consulte al m?dico. ?Intente reducir el nivel de estr?s. Si necesita ayuda para hacer esto, consulte al m?dico. ?Si tiene sobrepeso, baje una cantidad de peso saludable para usted. Consulte a su m?dico para bajar de peso de [place] segura. ?Instrucciones generales ?Est? atento a cualquier cambio en los s?ntomas. ?[employer] de venta libre y los recetados solamente como se lo haya indicado el m?dico. ?No tome aspirina, ibuprofeno ni otros antiinflamatorios no esteroideos (AINE) a menos que el m?dico lo autorice. ?Use ropa holgada. No use nada apretado alrededor de la cintura. ?Levante (eleve) la cabecera de la cama aproximadamente 6 pulgadas (15 cm). Para hacerlo, es posible que tenga que utilizar una cu?a. ?Evite inclinarse si al hacerlo empeoran los s?ntomas. ?Cumpla con todas las visitas de seguimiento. ?Comun?quese con un m?dico si: ?Aparecen nuevos s?ntomas. ?Adelgaza y no sabe por qu?. ?Tiene problemas para tragar o le duele cuando traga. ?Tiene sibilancias o tos persistente. ?Tiene la voz ronca. ?Los s?ntomas no mejoran con Scientist, research (medical). ?Solicite ayuda de inmediato si: ?Siente dolor repentino Sears [employer], el cuello, la mand?bula, los dientes o la espalda. ?De repente se siente transpirado, mareado o aturdido. ?Siente falta de aire o Journalist, newspaper. ?Vomita y el v?mito es de color verde, amarillo o negro, o tiene un aspecto similar a la sangre o a los posos de caf?Marland Kitchen ?Se desmaya. ?Las heces (deposiciones) son rojas, sanguinolentas o negras. ?No puede tragar, beber o comer. ?Estos s?ntomas pueden representar un problema grave que constituye Radio broadcast assistant. No espere a ver si los s?ntomas desaparecen. Solicite atenci?n m?dica de inmediato. Comun?quese con el servicio de emergencias de su localidad (911 en los [place]). No conduzca por sus propios medios [employer]. ?Resumen ?Si una  persona tiene enfermedad de reflujo gastroesof?gico (ERGE), los alimentos y el ?cido estomacal suben al es?fago y causan s?ntomas o problemas tales como da?o en el es?fago. ?El tratamiento depender? de la gravedad de los s?ntomas. ?Siga una dieta como se lo haya indicado el m?dico. ?Use todos los medicamentos solamente como se lo haya indicado el m?dico. ?Esta informaci?n no tiene Theme park manager el consejo del m?dico. Aseg?rese de hacerle al m?dico cualquier pregunta que tenga. ?Document Revised: 07/27/2020 Document Reviewed: 07/27/2020 ?Elsevier Patient Education ? 2023 Elsevier Inc. ? ? ?

## 2022-05-09 NOTE — Progress Notes (Signed)
? ? ? ?05/09/2022 ?[name] ?[id number] ?[birth year] ? ? ?CHIEF COMPLAINT: Stomach pain, history of H. Pylori  ? ?HISTORY OF PRESENT ILLNESS: [name] is a 41 year old female with a past medical history of anemia, hepatic steatosis per abdominal sono 2018, H. Pylori and GERD. S/P cholecystectomy secondary to gallstones 08/2017.  She speaks Spanish therefore she is accompanied by a [place] Spanish interpreter to facilitate communication throughout today's office visit.  She was diagnosed with H. pylori by breath test 07/27/2020 which was treated with Amoxicillin/Clarithromycin and PPI x 14 days as prescribed by her PCP.  Post H. pylori treatment breath test was not done.  She is concerned that her H. pylori infection has recurred.  She complains of having a bitter taste in her mouth with nausea and stomach noises which are similar symptoms she experienced when she was treated for H. pylori.  She denies having any abdominal pain.  She is taking Omeprazole 40 mg once daily for the past 3 to 4 months.  She is passing a normal formed brown bowel movement most days.  No rectal bleeding or black stools.  She denies ever having an EGD.  No known family history of esophageal, gastric or colon cancer.  ? ?She is uninsured but she has applied for [facility] financial assistance. ? ?Past Medical History:  ?Diagnosis Date  ? AMENORRHEA 04/04/2009  ? Qualifier: Diagnosis of  By: Kaylee Alfred MD, Lanora Manis    ? Anemia   ? Biliary colic   ? GERD (gastroesophageal reflux disease)   ? Symptomatic cholelithiasis   ? ?Past Surgical History:  ?Procedure Laterality Date  ? CHOLECYSTECTOMY N/A 09/22/2017  ? Procedure: LAPAROSCOPIC CHOLECYSTECTOMY;  Surgeon: Henrene Dodge, MD;  Location: [facility] ORS;  Service: General;  Laterality: N/A;  ? OVARIAN CYST REMOVAL    ? ?Social History: She is originally from [place].  She is married.  She has 1 son and 1 daughter.  Non-smoker.  No alcohol use.  No drug use. ? ?Family History:  She denies any family history of esophageal, gastric or colon cancer ? ?No Known Allergies ?  ?Outpatient Encounter Medications as of 05/09/2022  ?Medication Sig  ? omeprazole (PRILOSEC) 40 MG capsule Take 1 capsule (40 mg total) by mouth daily.  ? [DISCONTINUED] acetaminophen (TYLENOL) 500 MG tablet Take 500-1,000 mg by mouth every 8 (eight) hours as needed for mild pain. (Patient not taking: Reported on 01/02/2022)  ? [DISCONTINUED] amoxicillin (AMOXIL) 500 MG capsule Take 2 capsules (1,000 mg total) by mouth 2 (two) times daily. (Patient not taking: Reported on 01/02/2022)  ? [DISCONTINUED] cyclobenzaprine (FLEXERIL) 5 MG tablet Take 1 tablet (5 mg total) by mouth at bedtime as needed for muscle spasms. (Patient not taking: Reported on 07/15/2019)  ? [DISCONTINUED] fluticasone (FLONASE) 50 MCG/ACT nasal spray Place 2 sprays into both nostrils daily. (Patient not taking: Reported on 10/20/2019)  ? [DISCONTINUED] hydrOXYzine (VISTARIL) 25 MG capsule 1/2 to 1 tid prn anxiety (Patient not taking: Reported on 01/02/2022)  ? ?No facility-administered encounter medications on file as of 05/09/2022.  ? ? ?REVIEW OF SYSTEMS:  ?Gen: Denies fever, sweats or chills. No weight loss.  ?CV: Denies chest pain, palpitations or edema. ?Resp: Denies cough, shortness of breath of hemoptysis.  ?GI: See HPI. ?GU : Denies urinary burning, blood in urine, increased urinary frequency or incontinence. ?MS: Denies joint pain, muscles aches or weakness. ?Derm: Denies rash, itchiness, skin lesions or unhealing ulcers. ?Psych: + Anxiety.  ?Heme: Denies bruising, easy bleeding. ?Neuro:  Denies  headaches, dizziness or paresthesias. ?Endo:  Denies any problems with DM, thyroid or adrenal function. ? ?PHYSICAL EXAM: ?BP 122/88   Pulse 100   Ht 5\' 2"  (1.575 m)   Wt 192 lb (87.1 kg)   SpO2 98%   BMI 35.12 kg/m?  ?General: 41 year old female in no acute distress. ?Head: Normocephalic and atraumatic. ?Eyes:  Sclerae non-icteric, conjunctive pink. ?Ears:  Normal auditory acuity. ?Mouth: Dentition intact. No ulcers or lesions.  ?Neck: Supple, no lymphadenopathy or thyromegaly.  ?Lungs: Clear bilaterally to auscultation without wheezes, crackles or rhonchi. ?Heart: Regular rate and rhythm. No murmur, rub or gallop appreciated.  ?Abdomen: Soft, nontender, non distended. No masses. No hepatosplenomegaly. Normoactive bowel sounds x 4 quadrants.  ?Rectal: Deferred. ?Musculoskeletal: Symmetrical with no gross deformities. ?Skin: Warm and dry. No rash or lesions on visible extremities. ?Extremities: No edema. ?Neurological: Alert oriented x 4, no focal deficits.  ?Psychological:  Alert and cooperative. Normal mood and affect. ? ?ASSESSMENT AND PLAN: ? ?63) 41 year old female with a history of H. pylori per breath test 06/2020 treated with Amoxicillin/Clarithromycin and PPI x 14 days as prescribed by her PCP with nausea, acidic taste in her mouth and stomach grumbling which are similar symptoms she had when she previously tested positive for H. Pylori ?-EGD to rule out H. pylori gastritis, benefits and risks discussed including risk with sedation, risk of bleeding, perforation and infection  ?-GERD diet hand ?-Continue Omeprazole 40 mg daily, may add Famotidine 20 mg nightly as needed ?-Patient to contact office if symptoms worsen prior to EGD date ? ?2) Colon cancer screening ?-Recommend Colonoscopy at age 72  ? ? ? ? ? ? ? ? ?CC:  55, NP ? ? ? ?

## 2022-05-22 NOTE — Progress Notes (Signed)
Reviewed and agree with documentation and assessment and plan. K. Veena Haylen Bellotti , MD   

## 2022-06-10 ENCOUNTER — Encounter: Payer: Self-pay | Admitting: Gastroenterology

## 2022-06-10 ENCOUNTER — Ambulatory Visit (AMBULATORY_SURGERY_CENTER): Payer: Self-pay | Admitting: Gastroenterology

## 2022-06-10 VITALS — BP 133/86 | HR 82 | Temp 98.6°F | Resp 17 | Ht 62.0 in | Wt 192.0 lb

## 2022-06-10 DIAGNOSIS — R1013 Epigastric pain: Secondary | ICD-10-CM

## 2022-06-10 DIAGNOSIS — R110 Nausea: Secondary | ICD-10-CM

## 2022-06-10 DIAGNOSIS — K2970 Gastritis, unspecified, without bleeding: Secondary | ICD-10-CM

## 2022-06-10 DIAGNOSIS — K2950 Unspecified chronic gastritis without bleeding: Secondary | ICD-10-CM

## 2022-06-10 DIAGNOSIS — K219 Gastro-esophageal reflux disease without esophagitis: Secondary | ICD-10-CM

## 2022-06-10 MED ORDER — SODIUM CHLORIDE 0.9 % IV SOLN: 500.0000 mL | INTRAVENOUS | Status: DC

## 2022-06-10 MED ORDER — OMEPRAZOLE 40 MG PO CPDR: 40.0000 mg | DELAYED_RELEASE_CAPSULE | Freq: Every day | ORAL | 3 refills | Status: DC

## 2022-06-10 NOTE — Progress Notes (Signed)
[facility] History and Physical   Primary Care Physician:  Claiborne Rigg, NP   Reason for Procedure:  GERD, nausea, epigastric pain  Plan:    EGD  with possible interventions as needed     HPI: [name] is a very pleasant 41 y.o. female here for EGD for evaluation of persistent gerd symptoms, nausea and epigastric pain.  Please refer to office visit note by [facility] 05/09/22 for additional details  The risks and benefits as well as alternatives of endoscopic procedure(s) have been discussed and reviewed. All questions answered. The patient agrees to proceed.    Past Medical History:  Diagnosis Date   AMENORRHEA 04/04/2009   Qualifier: Diagnosis of  By: Delrae Alfred MD, Lanora Manis     Anemia    Biliary colic    GERD (gastroesophageal reflux disease)    Symptomatic cholelithiasis     Past Surgical History:  Procedure Laterality Date   CHOLECYSTECTOMY N/A 09/22/2017   Procedure: LAPAROSCOPIC CHOLECYSTECTOMY;  Surgeon: Henrene Dodge, MD;  Location: [facility] ORS;  Service: General;  Laterality: N/A;   OVARIAN CYST REMOVAL      Prior to Admission medications   Medication Sig Start Date End Date Taking? Authorizing Provider  omeprazole (PRILOSEC) 40 MG capsule Take 1 capsule (40 mg total) by mouth daily. 01/02/22   Anders Simmonds, PA-C    Current Outpatient Medications  Medication Sig Dispense Refill   omeprazole (PRILOSEC) 40 MG capsule Take 1 capsule (40 mg total) by mouth daily. 90 capsule 1   Current Facility-Administered Medications  Medication Dose Route Frequency Provider Last Rate Last Admin   0.9 %  sodium chloride infusion  500 mL Intravenous Continuous Nochum Fenter, Eleonore Chiquito, MD        Allergies as of 06/10/2022   (No Known Allergies)    Family History  Problem Relation Age of Onset   Healthy Mother    Healthy Father    Cancer Neg Hx    Colon cancer Neg Hx    Stomach cancer Neg Hx    Pancreatic cancer Neg Hx     Social History    Socioeconomic History   Marital status: Married    Spouse name: Not on file   Number of children: Not on file   Years of education: Not on file   Highest education level: Not on file  Occupational History   Not on file  Tobacco Use   Smoking status: Never   Smokeless tobacco: Never  Vaping Use   Vaping Use: Never used  Substance and Sexual Activity   Alcohol use: No   Drug use: No   Sexual activity: Yes    Birth control/protection: Pill  Other Topics Concern   Not on file  Social History Narrative   Not on file   Social Determinants of Health   Financial Resource Strain: Not on file  Food Insecurity: Not on file  Transportation Needs: Not on file  Physical Activity: Not on file  Stress: Not on file  Social Connections: Not on file  Intimate Partner Violence: Not on file    Review of Systems:  All other review of systems negative except as mentioned in the HPI.  Physical Exam: Vital signs in last 24 hours: BP (!) 144/91   Pulse (!) 102   Temp 98.6 F (37 C) (Temporal)   Resp 10   Ht 5\' 2"  (1.575 m)   Wt 192 lb (87.1 kg)   SpO2 97%   BMI 35.12 kg/m  General:  Alert, NAD Lungs:  Clear .   Heart:  Regular rate and rhythm Abdomen:  Soft, nontender and nondistended. Neuro/Psych:  Alert and cooperative. Normal mood and affect. A and O x 3  Reviewed labs, radiology imaging, old records and pertinent past GI work up  Patient is appropriate for planned procedure(s) and anesthesia in an ambulatory setting   K. Denzil Magnuson , MD [phone]

## 2022-06-10 NOTE — Progress Notes (Signed)
Called to room to assist during endoscopic procedure.  Patient ID and intended procedure confirmed with present staff. Received instructions for my participation in the procedure from the performing physician.  

## 2022-06-10 NOTE — Op Note (Signed)
[place] Patient Name: [name] Procedure Date: 06/10/2022 10:12 AM MRN: [id number] Endoscopist: Mauri Pole , MD Age: 41 Referring MD:  Date of Birth: 1981-05-13 Gender: Female Account #: [id number] Procedure:                Upper GI endoscopy Indications:              Epigastric abdominal pain, Dyspepsia, Esophageal                            reflux symptoms that persist despite appropriate                            therapy, Previously treated for Helicobacter                            pylori, Follow-up of Helicobacter pylori Medicines:                Monitored Anesthesia Care Procedure:                Pre-Anesthesia Assessment:                           - Prior to the procedure, a History and Physical                            was performed, and patient medications and                            allergies were reviewed. The patient's tolerance of                            previous anesthesia was also reviewed. The risks                            and benefits of the procedure and the sedation                            options and risks were discussed with the patient.                            All questions were answered, and informed consent                            was obtained. Prior Anticoagulants: The patient has                            taken no previous anticoagulant or antiplatelet                            agents. ASA Grade Assessment: II - A patient with                            mild systemic disease. After reviewing the risks  and benefits, the patient was deemed in                            satisfactory condition to undergo the procedure.                           After obtaining informed consent, the endoscope was                            passed under direct vision. Throughout the                            procedure, the patient's blood pressure, pulse, and                            oxygen  saturations were monitored continuously. The                            Endoscope was introduced through the mouth, and                            advanced to the second part of duodenum. The upper                            GI endoscopy was accomplished without difficulty.                            The patient tolerated the procedure well. Scope In: Scope Out: Findings:                 The esophagus was normal.                           The Z-line was regular and was found 38 cm from the                            incisors.                           Patchy minimal inflammation characterized by                            congestion (edema) and erythema was found in the                            entire examined stomach. Biopsies were taken with a                            cold forceps for Helicobacter pylori testing.                           The cardia and gastric fundus were normal on                            retroflexion.  The examined duodenum was normal. Complications:            No immediate complications. Estimated Blood Loss:     Estimated blood loss was minimal. Impression:               - Normal esophagus.                           - Z-line regular, 38 cm from the incisors.                           - Gastritis. Biopsied.                           - Normal examined duodenum. Recommendation:           - Patient has a contact number available for                            emergencies. The signs and symptoms of potential                            delayed complications were discussed with the                            patient. Return to normal activities tomorrow.                            Written discharge instructions were provided to the                            patient.                           - Resume previous diet.                           - Continue present medications.                           - Await pathology results.                            - Follow an antireflux regimen.                           - Use Prilosec (omeprazole) 40 mg PO daily. Mauri Pole, MD 06/10/2022 10:37:03 AM This report has been signed electronically.

## 2022-06-10 NOTE — Progress Notes (Signed)
To pacu, VSS. Report to [email or web address] 

## 2022-06-10 NOTE — Progress Notes (Signed)
Pt's states no medical or surgical changes since previsit or office visit. 

## 2022-06-10 NOTE — Patient Instructions (Addendum)
Resume previous diet.  Await pathology results.  Handout given for gastritis  Follow anti-reflux regimen.  Use Prilosec ( omeprazole) 40 mg orally daily.   YOU HAD AN ENDOSCOPIC PROCEDURE TODAY AT THE [facility]:   Refer to the procedure report that was given to you for any specific questions about what was found during the examination.  If the procedure report does not answer your questions, please call your gastroenterologist to clarify.  If you requested that your care partner not be given the details of your procedure findings, then the procedure report has been included in a sealed envelope for you to review at your convenience later.  YOU SHOULD EXPECT: Some feelings of bloating in the abdomen. Passage of more gas than usual.  Walking can help get rid of the air that was put into your GI tract during the procedure and reduce the bloating. If you had a lower endoscopy (such as a colonoscopy or flexible sigmoidoscopy) you may notice spotting of blood in your stool or on the toilet paper. If you underwent a bowel prep for your procedure, you may not have a normal bowel movement for a few days.  Please Note:  You might notice some irritation and congestion in your nose or some drainage.  This is from the oxygen used during your procedure.  There is no need for concern and it should clear up in a day or so.  SYMPTOMS TO REPORT IMMEDIATELY:   Following upper endoscopy (EGD)  Vomiting of blood or coffee ground material  New chest pain or pain under the shoulder blades  Painful or persistently difficult swallowing  New shortness of breath  Fever of 100F or higher  Black, tarry-looking stools  For urgent or emergent issues, a gastroenterologist can be reached at any hour by calling [phone]. Do not use MyChart messaging for urgent concerns.    DIET:  We do recommend a small meal at first, but then you may proceed to your regular diet.  Drink plenty of fluids but you  should avoid alcoholic beverages for 24 hours.  ACTIVITY:  You should plan to take it easy for the rest of today and you should NOT DRIVE or use heavy machinery until tomorrow (because of the sedation medicines used during the test).    FOLLOW UP: Our staff will call the number listed on your records 24-72 hours following your procedure to check on you and address any questions or concerns that you may have regarding the information given to you following your procedure. If we do not reach you, we will leave a message.  We will attempt to reach you two times.  During this call, we will ask if you have developed any symptoms of COVID 19. If you develop any symptoms (ie: fever, flu-like symptoms, shortness of breath, cough etc.) before then, please call [phone].  If you test positive for Covid 19 in the 2 weeks post procedure, please call and report this information to [place].    If any biopsies were taken you will be contacted by phone or by letter within the next 1-3 weeks.  Please call us at [phone] if you have not heard about the biopsies in 3 weeks.    SIGNATURES/CONFIDENTIALITY: You and/or your care partner have signed paperwork which will be entered into your electronic medical record.  These signatures attest to the fact that that the information above on your After Visit Summary has been reviewed and is understood.  Full responsibility  of the confidentiality of this discharge information lies with you and/or your care-partner.

## 2022-06-10 NOTE — Progress Notes (Signed)
Interpreter used today at the [facility] for this pt.  Interpreter's name is-  Training and development officer

## 2022-06-11 ENCOUNTER — Telehealth: Payer: Self-pay

## 2022-06-11 NOTE — Telephone Encounter (Signed)
No answer, left message

## 2022-06-19 ENCOUNTER — Encounter: Payer: Self-pay | Admitting: Gastroenterology

## 2022-07-20 IMAGING — CR DG CHEST 2V
2 series · 2 of 2 positions shown · non-contrast
Comparison: None.

CLINICAL DATA: Palpitations.

EXAM:
CHEST - 2 VIEW

[w chest pa]
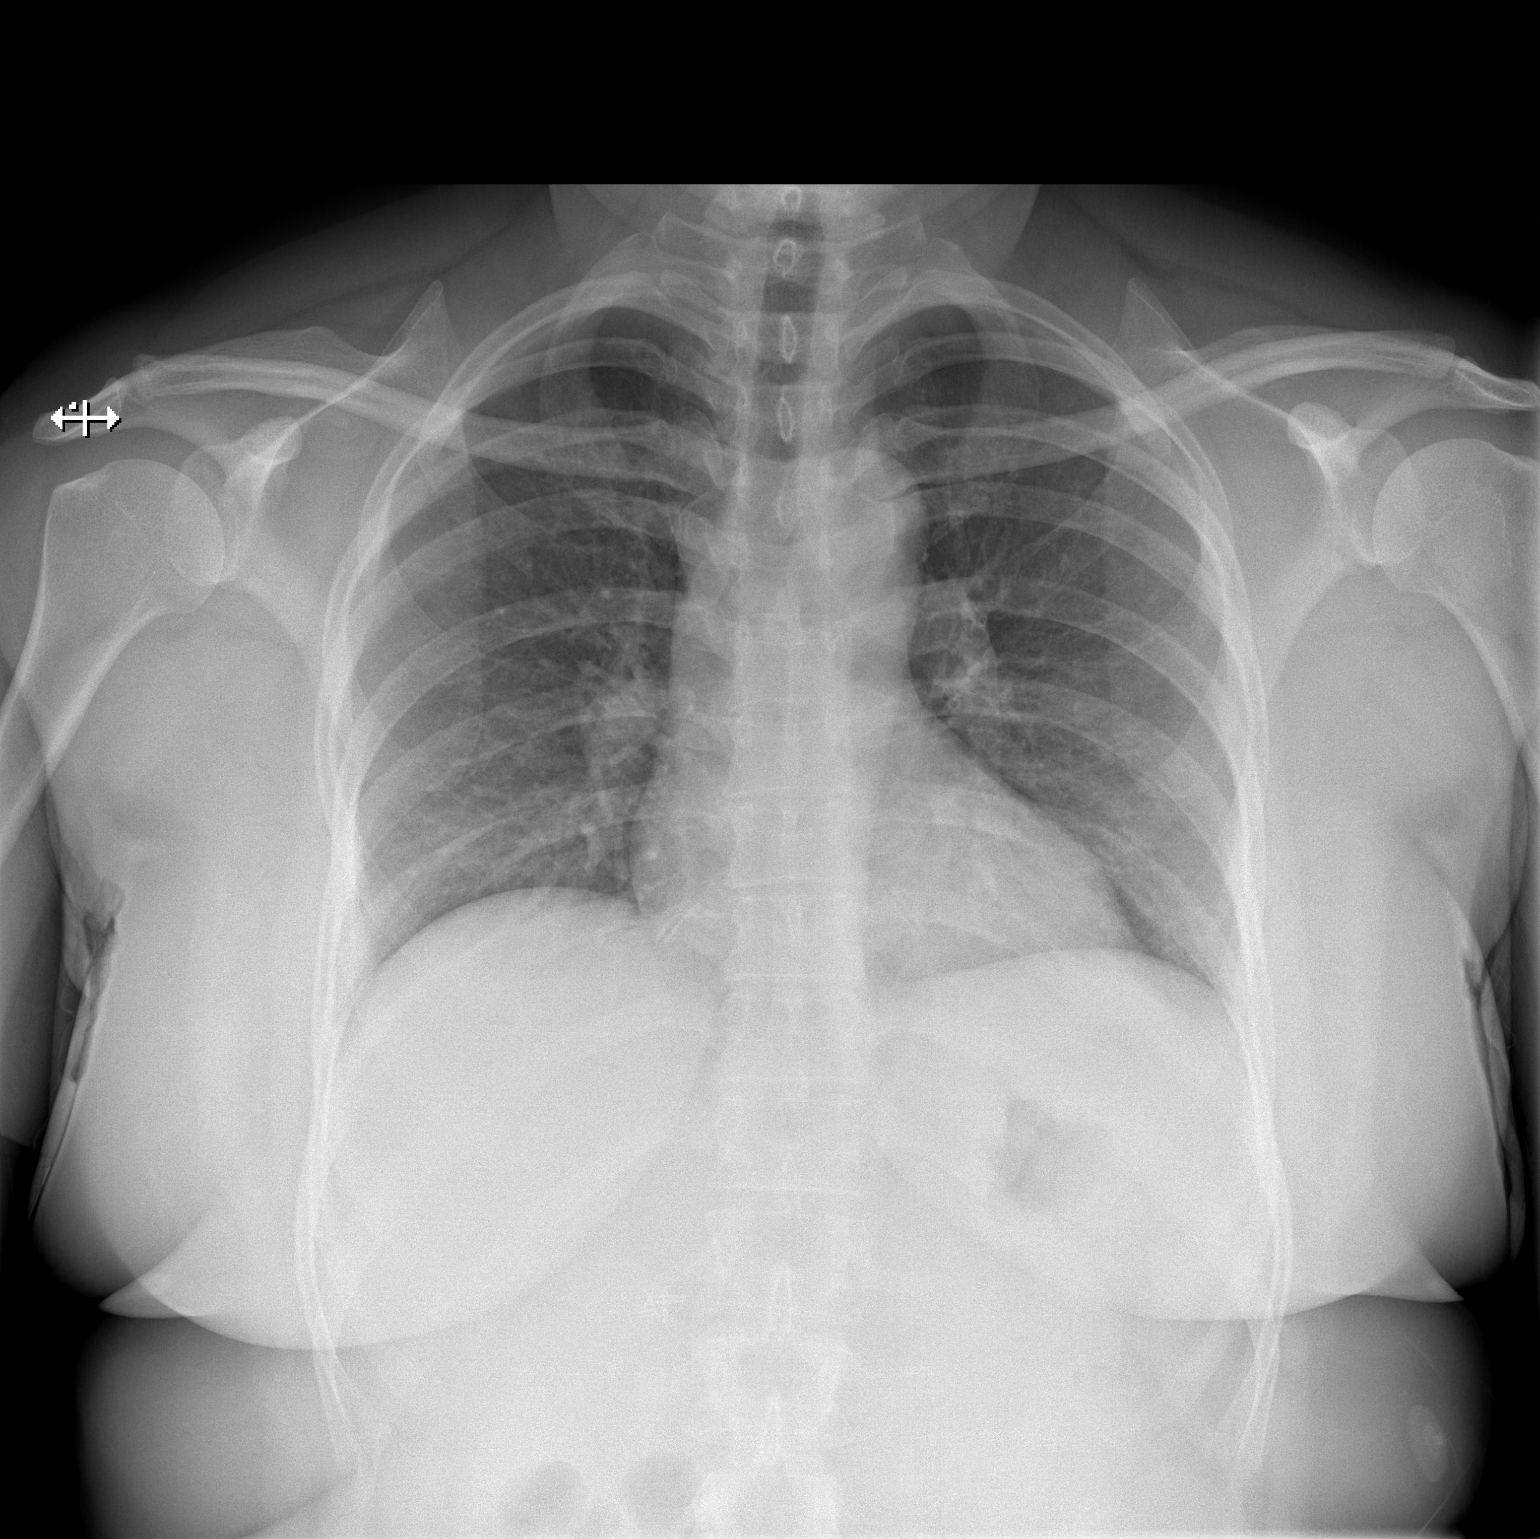

[w chest lat]
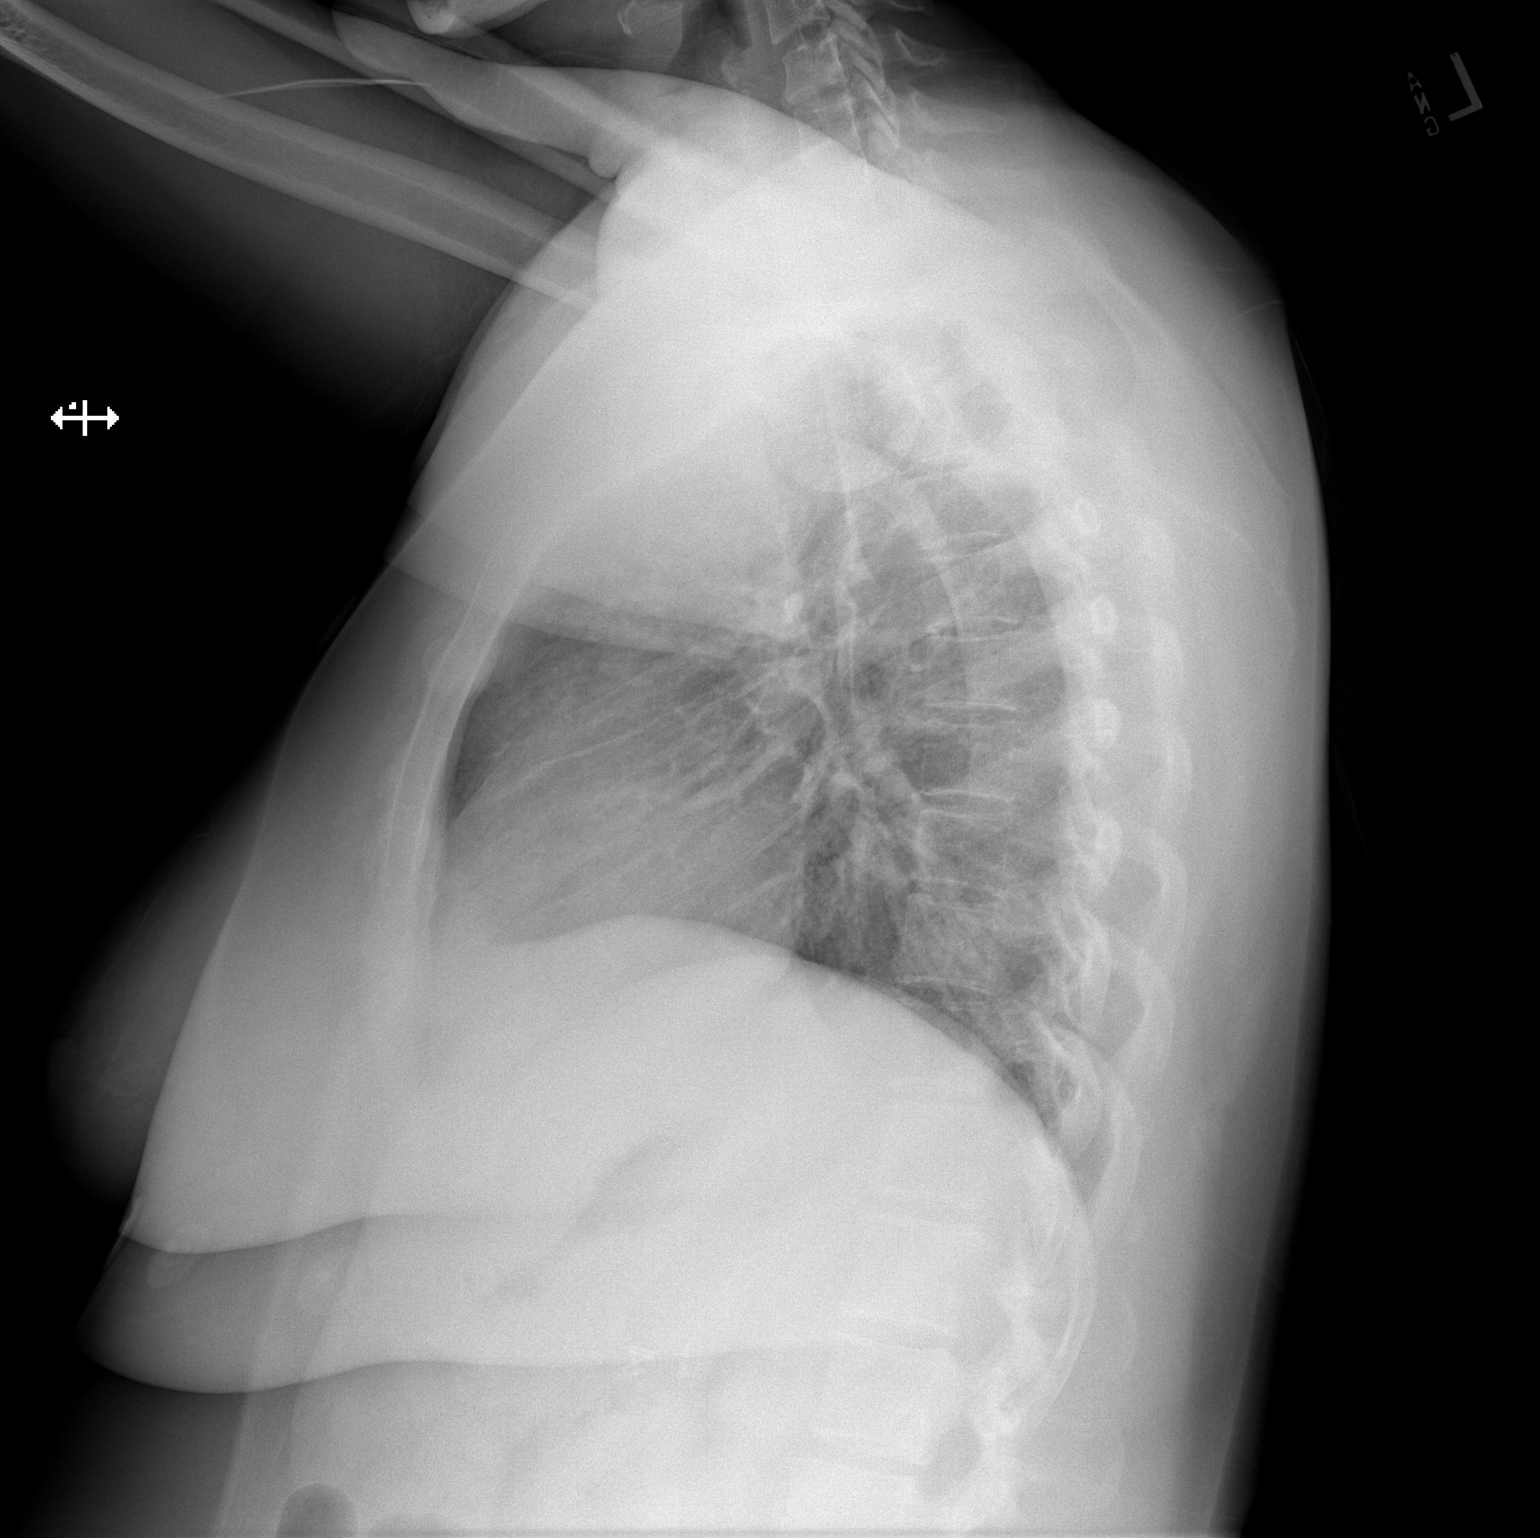

[2 of 2 positions shown; findings below may reference images not displayed]

FINDINGS: Lungs are somewhat hypoinflated and otherwise clear.
Cardiomediastinal silhouette, bones and soft tissues are normal.
IMPRESSION: No acute findings.

## 2022-08-18 ENCOUNTER — Ambulatory Visit: Payer: Self-pay | Admitting: Nurse Practitioner

## 2022-09-03 ENCOUNTER — Ambulatory Visit: Payer: Self-pay | Attending: Nurse Practitioner | Admitting: Physician Assistant

## 2022-09-03 ENCOUNTER — Encounter: Payer: Self-pay | Admitting: Physician Assistant

## 2022-09-03 VITALS — BP 138/90 | HR 113 | Ht 62.0 in | Wt 195.4 lb

## 2022-09-03 DIAGNOSIS — Z789 Other specified health status: Secondary | ICD-10-CM

## 2022-09-03 DIAGNOSIS — L259 Unspecified contact dermatitis, unspecified cause: Secondary | ICD-10-CM

## 2022-09-03 DIAGNOSIS — R030 Elevated blood-pressure reading, without diagnosis of hypertension: Secondary | ICD-10-CM

## 2022-09-03 MED ORDER — PREDNISONE 10 MG PO TABS: ORAL_TABLET | ORAL | 0 refills | Status: DC

## 2022-09-03 MED ORDER — TRIAMCINOLONE ACETONIDE 0.1 % EX CREA: 1.0000 | TOPICAL_CREAM | Freq: Two times a day (BID) | CUTANEOUS | 0 refills | Status: DC

## 2022-09-03 MED ORDER — CETIRIZINE HCL 10 MG PO TABS: 10.0000 mg | ORAL_TABLET | Freq: Every day | ORAL | 11 refills | Status: DC

## 2022-09-03 NOTE — Patient Instructions (Addendum)
Dermatitis de contacto Contact Dermatitis La dermatitis es el enrojecimiento, el dolor y la hinchazn (inflamacin) de la piel. La dermatitis de contacto es una reaccin a algo que toca la piel. Hay dos tipos de dermatitis de contacto: Dermatitis de contacto irritativa. Esto ocurre cuando algo molesta (irrita) la piel, [place]. Dermatitis de contacto alrgica. Esto se produce cuando una persona se expone a algo a lo que es Counselling psychologist, como la hiedra venenosa. Cules son las causas? Las causas frecuentes de la dermatitis de contacto irritante incluyen las siguientes: Maquillaje. Jabones. Detergentes. Lavandina. cidos. Metales, como el nquel. Las causas frecuentes de la dermatitis de contacto alrgica incluyen las siguientes: Plantas. Productos qumicos. Alhajas. Ltex. Medicamentos. Conservantes que se utilizan en determinados productos, como la ropa. Qu incrementa el riesgo? Tener un trabajo que lo expone a cosas que causan molestias en la piel. Tener asma o eczema. Cules son los signos o los sntomas? Los sntomas pueden ocurrir en cualquier parte de la piel que la sustancia irritante haya tocado. Algunos de los sntomas son los siguientes: Piel seca o descamada. Enrojecimiento. Grietas. Picazn. Dolor o sensacin de ardor. Ampollas. Sangre o lquido transparente que drena de las grietas de la piel. En el caso de la dermatitis de Engineer, technical sales, puede haber hinchazn. Esto puede ocurrir en [employer] prpados, la boca o los genitales. Cmo se trata? Esta afeccin se trata mediante un control para detectar la causa de la reaccin y proteger la piel. El tratamiento tambin puede incluir lo siguiente: Ungentos, medicamentos o cremas con corticoesteroides. Antibiticos u otros ungentos, si tiene una infeccin en la piel. Lociones o medicamentos para Associate Professor. Una venda (vendaje). Siga estas instrucciones en su casa: Cuidado de la piel Humctese  la piel segn sea necesario. Aplique compresas fras sobre la piel. Pngase una pasta de bicarbonato de [employer]. Agregue agua al bicarbonato de sodio hasta que parezca una pasta. No se rasque la piel. Evite que las cosas le rocen la piel. Evite el uso de [place], perfumes y tintes. Medicamentos Tome o aplique los medicamentos de venta libre y los recetados solamente como se lo haya indicado el mdico. Si le recetaron un antibitico, tmelo o aplquelo como se lo haya indicado el mdico. No deje de usarlo aunque la afeccin empiece a mejorar. Baos Tome un bao con: Sales de [place]. Bicarbonato de sodio. Avena coloidal. Bese con menos frecuencia. Bese con agua tibia. No use agua caliente. Cuidado de la venda Si le colocaron una venda, [place] segn se lo haya indicado el mdico. Lvese las manos con agua y [place] antes y despus de cambiarse la venda. Use desinfectante para manos si no dispone de [place] y [place]. Instrucciones generales Evite las cosas que le causaron la reaccin. Si no sabe qu la caus, lleve un diario. Escriba los siguientes datos: Lo que come. Los productos para la piel que [place]. Lo que bebe. Lo que lleva puesto en la zona que tiene los sntomas. Esto incluye las alhajas. Controle todos los das las zonas afectadas para detectar signos de infeccin. Est atento a los siguientes signos: Aumento del enrojecimiento, la hinchazn o Chief Technology Officer. Ms lquido [place]. Calor. Pus o mal olor. Concurra a todas las visitas de [address] se lo haya indicado el mdico. Esto es importante. Comunquese con un mdico si: No mejora con el tratamiento. Su afeccin empeora. Tiene signos de infeccin, como los siguientes: Aumento de la hinchazn. Dolor a Insurance claims handler. Aumento del enrojecimiento. Molestias. Calor.  Tiene fiebre. Aparecen nuevos sntomas. Solicite ayuda inmediatamente si: Tiene un dolor de cabeza muy intenso. Siente dolor en el cuello. Tiene  el cuello rgido. Vomita. Se siente muy somnoliento. Nota unas lneas rojas en la piel que salen de la zona. El hueso o la articulacin que se encuentran cerca de la zona le duelen despus de que la piel se [place]. La zona se oscurece. Tiene dificultad para respirar. Resumen La dermatitis es el enrojecimiento, el dolor y la hinchazn de la piel. Los sntomas pueden ocurrir en donde la sustancia irritante lo ha tocado. El tratamiento puede incluir medicamentos y cuidado de la piel. Si no conoce la causa de Radiation protection practitioner, lleve un diario. Comunquese con un mdico si su afeccin empeora o si tiene signos de infeccin. Esta informacin no tiene Theme park manager el consejo del mdico. Asegrese de hacerle al mdico cualquier pregunta que tenga. Document Revised: 10/24/2021 Document Reviewed: 10/24/2021 Elsevier Patient Education  2023 Elsevier Inc. Prevencin de la hipertensin Preventing Hypertension La hipertensin, tambin conocida como presin arterial alta, se produce cuando la sangre bombea en las arterias con demasiada fuerza. Las arterias son vasos sanguneos que transportan la sangre desde el corazn al resto del cuerpo. Con frecuencia, la hipertensin no causa sntomas hasta que la presin arterial es muy alta. Es importante que controle regularmente su presin arterial. Los cambios en la dieta y el estilo de vida pueden ayudar a prevenir la hipertensin y a Estate agent mejor al mejorar su calidad de vida. Si ya tiene hipertensin, puede controlarla con cambios en la dieta y el estilo de vida y con medicamentos. Cmo puede afectarme esta enfermedad? Con el transcurso del [place], la hipertensin puede daar las arterias y Engineer, manufacturing systems flujo de sangre hacia partes importantes del cuerpo que incluyen el cerebro, el corazn y los riones. Si mantiene su presin arterial en un nivel saludable, podr prevenir complicaciones como un infarto de miocardio, insuficiencia cardaca, un  accidente cerebrovascular, insuficiencia renal y demencia vascular. Qu puede aumentar el riesgo? Una alimentacin poco saludable y la falta de actividad fsica pueden aumentar las probabilidades de tener presin arterial alta. Algunos otros factores de riesgo son los siguientes: Edad. El riesgo aumenta con la edad. Tener familiares que han tenido presin arterial alta. Tener ciertas afecciones, como problemas de tiroides. Tener sobrepeso u obesidad. Consumir cafena o alcohol en exceso. Consumir mucha grasa, azcar, caloras o sal (sodio) en su dieta. Fumar o consumir drogas ilegales. Tomar ciertos medicamentos, como antidepresivos, descongestivos, pldoras anticonceptivas y antiinflamatorios no esteroideos (AINE), como el ibuprofeno. Qu medidas puedo tomar para prevenir o controlar esta afeccin? Trabaje junto al mdico para desarrollar un plan de prevencin de la hipertensin que funcione para usted. Es posible que lo deriven para que reciba asesoramiento sobre una dieta saludable y Doroteo Glassman fsica. Siga su plan y Joelyn Oms a todas las visitas de seguimiento. Cambios en la dieta Siga una dieta saludable. Esto puede comprender lo siguiente: Menor ingesta de sal (sodio). Pregntele al mdico cunto sodio puede consumir de forma segura. La recomendacin general es consumir menos de 1 cucharadita (2300 mg) de sodio por da. No agregue sal a las comidas. Opte por alimentos con bajo contenido de sodio cuando realice las compras o coma fuera de casa. Limite la cantidad de grasa en la dieta. Esto se puede lograr con [employer] o de bajo contenido de grasas e ingiriendo menor cantidad de carnes rojas. Coma ms frutas, verduras y cereales integrales. Establezca un objetivo para comer: 1 a  2 tazas de frutas y verduras frescas todos los Aromas. 3 a 4 porciones de cereales [employer]. Evite los alimentos y las bebidas que tengan azcares agregados. Coma pescados que contengan  grasas saludables (cidos grasos omega-3), como la caballa o el salmn. Si necesita implementar un plan de comidas saludable, pruebe la dieta DASH. Esta dieta tiene un alto contenido de frutas, verduras y Radiation protection practitioner. Incluye poca cantidad de sodio, carnes rojas y azcares agregados. DASH es la sigla en ingls de "Enfoques Alimentarios para Detener la Hipertensin". Cambios en el estilo de vida  Baje de peso si es necesario. Con tan solo bajar entre el 3 % y el 5 % del peso corporal, puede prevenir o Chief Operating Officer la hipertensin. Por ejemplo, si su peso actual es de 200 libras (91 kg), una prdida entre el 3 % y el 5 % de su peso significa perder entre 6 y 10 libras (2.7 a 4.5 kg). Pdale al mdico que le recomiende una dieta y un plan de ejercicios para bajar de peso de forma segura. Ejerctate lo suficiente. Debe realizar al menos 150 minutos de ejercicios de intensidad moderada todas las semanas. Puede realizar [employer] en sesiones cortas de ejercicios, varias veces al da, o puede realizar sesiones ms largas, pero menos veces por semana. Por ejemplo, puede realizar una caminata enrgica o andar en bicicleta durante 10 minutos, 3 veces al da, durante 5 das a la [place]. Encuentre maneras de reducir el estrs, como hacer ejercicios, Primary school teacher, Optometrist o tomar una clase de yoga. Si necesita ayuda para reducir [employer] de estrs, consulte al mdico. No consuma ningn producto que contenga nicotina o tabaco. Estos productos incluyen cigarrillos, tabaco para Theatre manager y aparatos de vapeo, como los Administrator, Civil Service. Las sustancias qumicas presentes en los productos con tabaco y nicotina elevan su presin arterial cada vez que los consume. Si necesita ayuda para dejar de consumir estos productos, consulte al mdico. Aprenda a medir su presin arterial en casa. Asegrese de Solicitor su objetivo de presin arterial, como se lo haya indicado el mdico. Trate de dormir entre 7 y 9 horas todas  las noches. Consumo de alcohol No beba alcohol si: Su mdico le indica no hacerlo. Est embarazada, puede estar embarazada o est tratando de [place]. Si bebe alcohol: Limite la cantidad que bebe a lo siguiente: De 0 a 1 medida por da para las mujeres. De 0 a 2 medidas por da para los hombres. Sepa cunta cantidad de alcohol hay en las bebidas que toma. En los [address], una medida equivale a una botella de cerveza de 12 oz (355 ml), un vaso de vino de 5 oz (148 ml) o un vaso de una bebida alcohlica de alta graduacin de 1 oz (44 ml). Medicamentos Adems de los cambios en la dieta y el estilo de vida, [place] mdico podr indicarle medicamentos para ayudarle a Publishing copy su presin arterial. En general: Tal vez deba probar distintos medicamentos hasta encontrar el ms adecuado para usted. Quiz necesite tomar ms de un medicamento. Use los medicamentos de venta libre y los recetados solamente como se lo haya indicado el mdico. Preguntas para hacerle al mdico Cul es mi presin arterial ideal? Cmo disminuyo mi riesgo de tener presin arterial alta? Cmo debo controlar mi presin arterial en casa? Dnde obtener apoyo Su mdico puede ayudarle a prevenir la hipertensin y Pharmacologist su presin arterial en un nivel saludable. Su hospital o comunidad locales tambin pueden proporcionarle servicios y Radiation protection practitioner de  prevencin. La American Heart Association (Asociacin Estadounidense del Corazn) ofrece un red de ayuda en lnea en [email or web address] Dnde obtener ms informacin Obtenga ms informacin sobre la hipertensin en: Armed forces training and education officer, Lung, and Blood Institute (Instituto Nacional del [place], los Pulmones y Risk manager): [email or web address] Centers for Disease Control and Prevention (Centros para el Control y la Prevencin de Event organiser): [email or web address] American Academy of Family Physicians (Academia Estadounidense de Mdicos de [place]): [email or web address] Obtenga ms  informacin sobre la dieta DASH en: BJ's, Lung, and Blood Institute (Instituto [employer] del [place], los Pulmones y Risk manager): [email or web address] Comunquese con un mdico si: Piensa que tiene una reaccin alrgica a los medicamentos que ha tomado. Tiene mareos o dolores de cabeza con Naval architect. Tiene hinchazn en los tobillos. Tiene problemas de visin. Solicite ayuda de inmediato si: Tiene un dolor o Dentist repentino o intenso en el pecho, la espalda o el abdomen. Le falta el aire. Tienes un dolor de cabeza repentino e intenso. Estos sntomas pueden Customer service manager. Solicite ayuda de inmediato. Llame al 911. No espere a ver si los sntomas desaparecen. No conduzca por sus propios medios [employer] hospital. Resumen La hipertensin con frecuencia no provoca sntomas hasta que la presin arterial es muy alta. Es importante que controle regularmente su presin arterial. Los cambios en la dieta y el estilo de vida son pasos importantes para prevenir la hipertensin. Si mantiene su presin arterial en un nivel saludable, podr prevenir complicaciones como un infarto de miocardio, insuficiencia cardaca, un accidente cerebrovascular e insuficiencia renal. Trabaje junto al mdico para desarrollar un plan de prevencin de la hipertensin que funcione para usted. Esta informacin no tiene Theme park manager el consejo del mdico. Asegrese de hacerle al mdico cualquier pregunta que tenga. Document Revised: 10/30/2021 Document Reviewed: 10/30/2021 Elsevier Patient Education  2023 [employer].

## 2022-09-03 NOTE — Progress Notes (Signed)
Patient ID: [name], female   DOB: [birth year], 41 y.o.   MRN: [id number]   [name], is a 41 y.o. female  YQM:[id number]  [id number]  DOB - [birth year]  Chief Complaint  Patient presents with   Rash       Subjective:   [name] is a 41 y.o. female here today for 3 week h/o rash on her arms.  Very itchy.  Some blisters at first.  she does work out in the yard a lot.  Unsure if contact with PI or PO.  No herald patch.  No fever.  She gets very anxious about coming to the doctor.  No new soaps or detergents.    No HA/CP/SOB/dizziness or palpitations   No problems updated.  ALLERGIES: No Known Allergies  PAST MEDICAL HISTORY: Past Medical History:  Diagnosis Date   AMENORRHEA 04/04/2009   Qualifier: Diagnosis of  By: Delrae Alfred MD, Lanora Manis     Anemia    Biliary colic    GERD (gastroesophageal reflux disease)    Symptomatic cholelithiasis     MEDICATIONS AT HOME: Prior to Admission medications   Medication Sig Start Date End Date Taking? Authorizing Provider  cetirizine (ZYRTEC) 10 MG tablet Take 1 tablet (10 mg total) by mouth daily. 09/03/22  Yes Jayvan Mcshan, Marzella Schlein, PA-C  omeprazole (PRILOSEC) 40 MG capsule Take 1 capsule (40 mg total) by mouth daily. 06/10/22  Yes Napoleon Form, MD  predniSONE (DELTASONE) 10 MG tablet 6,5,4,3,2,1 take each days dose in am with food 09/03/22  Yes Jashira Cotugno M, PA-C  triamcinolone cream (KENALOG) 0.1 % Apply 1 Application topically 2 (two) times daily. 09/03/22  Yes Aarush Stukey M, PA-C    ROS: Neg HEENT Neg resp Neg cardiac Neg GI Neg GU Neg MS Neg psych Neg neuro  Objective:   Vitals:   09/03/22 1514 09/03/22 1527  BP: (!) 140/96 (!) 138/90  Pulse: (!) 113   SpO2: 99%   Weight: 195 lb 6.4 oz (88.6 kg)   Height: 5\' 2"  (1.575 m)    Exam General appearance : Awake, alert, not in any distress. Speech Clear. Not toxic looking HEENT: Atraumatic and Normocephalic Neck: Supple, no  JVD. No cervical lymphadenopathy.  Chest: Good air entry bilaterally, CTAB.  No rales/rhonchi/wheezing CVS: S1 S2 regular, no murmurs.  Extremities: B/L Lower Ext shows no edema, both legs are warm to touch Neurology: Awake alert, and oriented X 3, CN II-XII intact, Non focal Skin: contact distribution/scattered in streak like pattern what appears to be PI or PO in various stages of healing  Data Review No results found for: "HGBA1C"  Assessment & Plan   1. Contact dermatitis, unspecified contact dermatitis type, unspecified trigger - triamcinolone cream (KENALOG) 0.1 %; Apply 1 Application topically 2 (two) times daily.  Dispense: 45 g; Refill: 0 - cetirizine (ZYRTEC) 10 MG tablet; Take 1 tablet (10 mg total) by mouth daily.  Dispense: 30 tablet; Refill: 11 - predniSONE (DELTASONE) 10 MG tablet; 6,5,4,3,2,1 take each days dose in am with food  Dispense: 21 tablet; Refill: 0 (Blood sugar reviewed from labs 12/2021)  2. Elevated blood pressure reading We have discussed target BP range and blood pressure goal. I have advised patient to check BP regularly and to call 01/2022 back or report to clinic if the numbers are consistently higher than 130/85. We discussed the importance of compliance with medical therapy and DASH diet recommended, consequences of uncontrolled hypertension discussed.    3. Language barrier [employer]) interpreters  used and additional time performing visit was required.     Return if symptoms worsen or fail to improve.  The patient was given clear instructions to go to ER or return to medical center if symptoms don't improve, worsen or new problems develop. The patient verbalized understanding. The patient was told to call to get lab results if they haven't heard anything in the next week.      Georgian Co, PA-C [facility] and [facility] [place], [place] [phone]   09/03/2022, 3:28 PM

## 2022-12-17 ENCOUNTER — Telehealth: Payer: Self-pay | Admitting: Emergency Medicine

## 2022-12-17 NOTE — Telephone Encounter (Signed)
Copied from CRM [id number]. Topic: General - Other >> Dec 17, 2022 12:39 PM Lyman Speller wrote: Reason for CRM: Pt needs to renew her orange card and needs to speak with financial counselor /it expired  please advise

## 2023-05-20 ENCOUNTER — Encounter: Payer: Self-pay | Admitting: Physician Assistant

## 2023-05-20 ENCOUNTER — Ambulatory Visit: Payer: Self-pay | Attending: Physician Assistant | Admitting: Physician Assistant

## 2023-05-20 ENCOUNTER — Other Ambulatory Visit (HOSPITAL_COMMUNITY)
Admission: RE | Admit: 2023-05-20 | Discharge: 2023-05-20 | Disposition: A | Discharge status: Home / Self Care | Payer: Self-pay | Source: Ambulatory Visit | Attending: Physician Assistant | Admitting: Physician Assistant

## 2023-05-20 VITALS — BP 128/78 | HR 102 | Wt 199.4 lb

## 2023-05-20 DIAGNOSIS — Z758 Other problems related to medical facilities and other health care: Secondary | ICD-10-CM

## 2023-05-20 DIAGNOSIS — Z603 Acculturation difficulty: Secondary | ICD-10-CM

## 2023-05-20 DIAGNOSIS — R030 Elevated blood-pressure reading, without diagnosis of hypertension: Secondary | ICD-10-CM

## 2023-05-20 DIAGNOSIS — R399 Unspecified symptoms and signs involving the genitourinary system: Secondary | ICD-10-CM

## 2023-05-20 LAB — POCT URINALYSIS DIP (CLINITEK)
Bilirubin, UA: NEGATIVE
Blood, UA: NEGATIVE
Glucose, UA: NEGATIVE mg/dL
Ketones, POC UA: NEGATIVE mg/dL
Leukocytes, UA: NEGATIVE
Nitrite, UA: NEGATIVE
POC PROTEIN,UA: NEGATIVE
Spec Grav, UA: 1.025 (ref 1.010–1.025)
Urobilinogen, UA: 0.2 E.U./dL
pH, UA: 6 (ref 5.0–8.0)

## 2023-05-20 MED ORDER — SULFAMETHOXAZOLE-TRIMETHOPRIM 800-160 MG PO TABS
1.0000 | ORAL_TABLET | Freq: Two times a day (BID) | ORAL | 0 refills | Status: DC
Start: 2023-05-20 — End: 2024-04-11

## 2023-05-20 NOTE — Patient Instructions (Signed)
Infecci?n urinaria en los adultos ?Urinary Tract Infection, Adult ? ?Una infecci?n urinaria (IU) puede ocurrir en cualquier lugar de las v?as urinarias. Las v?as urinarias incluyen a los ri?ones, los ur?teres, la vejiga y la uretra. Estos ?rganos fabrican, almacenan y eliminan la orina del organismo. ?La IU alta afecta los ur?teres y los ri?ones. La IU baja afecta la vejiga y la uretra. ??Cu?les son las causas? ?La mayor?a de las infecciones de las v?as urinarias es causada por la presencia de bacterias en la zona genital, alrededor de la uretra, por donde sale la orina del cuerpo. Estas bacterias proliferan y causan inflamaci?n en las v?as urinarias. ??Qu? incrementa el riesgo? ?Es m?s probable que sufra esta afecci?n si: ?Tiene colocado un cat?ter urinario permanente. ?No puede controlar cu?ndo orinar o defecar (incontinencia). ?Es mujer y usted: ?Utiliza espermicida o diafragma como m?todo anticonceptivo. ?Tiene niveles bajos de estr?genos. ?Est? embarazada. ?Tiene ciertos genes que aumentan su riesgo. ?Es sexualmente activa. ?Toma antibi?ticos. ?Tiene una afecci?n que causa que el flujo de orina sea lento, como: ?Pr?stata agrandada, si usted es hombre. ?Obstrucci?n de la uretra. ?C?lculo renal. ?Una afecci?n nerviosa que afecta el control de la vejiga (vejiga neur?gena). ?No bebe lo suficiente o no orina con frecuencia. ?Tiene ciertas enfermedades cr?nicas, como: ?Diabetes. ?Un sistema que combate las enfermedades (sistemainmunitario) debilitado. ?Anemia drepanoc?tica. ?Gota. ?Lesi?n en la m?dula espinal. ??Cu?les son los signos o s?ntomas? ?Los s?ntomas de esta afecci?n incluyen: ?Necesidad inmediata (urgencia) de orinar. ?Micci?n frecuente. Esto puede incluir peque?as cantidades de orina cada vez que orina. ?Ardor o dolor al orinar. ?Presencia de sangre en la orina. ?Orina con mal olor u olor at?pico. ?Dificultad para orinar. ?Orina turbia. ?Secreci?n vaginal, si es mujer. ?Dolor en el abdomen o en la parte  inferior de la espalda. ?Es posible que tambi?n tenga: ?V?mitos o disminuci?n del apetito. ?Confusi?n. ?Irritabilidad o cansancio. ?Fiebre o escalofr?os. ?Diarrea. ?El primer s?ntoma en los adultos mayores puede ser la confusi?n. En algunos casos, es posible que no tengan s?ntomas hasta que la infecci?n empeore. ??C?mo se diagnostica? ?Esta afecci?n se diagnostica en funci?n de sus antecedentes m?dicos y de un examen f?sico. Tambi?n pueden hacerle otras pruebas, incluidas las siguientes: ?An?lisis de orina. ?An?lisis de sangre. ?Pruebas de infecciones de transmisi?n sexual (ITS). ?Si ha tenido m?s de una infecci?n urinaria (IU), se pueden hacer estudios de diagn?stico por im?genes o una cistoscopia para determinar la causa de las infecciones. ??C?mo se trata? ?El tratamiento de esta afecci?n incluye lo siguiente: ?Antibi?ticos. ?Medicamentos de venta libre para aliviar las molestias. ?Beber una cantidad suficiente agua para mantenerse hidratado. ?Si tiene infecciones con frecuencia o tiene otras afecciones, como un c?lculo renal, es posible que deba ver a un m?dico especialista en las v?as urinarias (ur?logo). ?En casos poco frecuentes, las infecciones urinarias pueden provocar sepsis. La sepsis es una afecci?n potencialmente mortal que se produce cuando el cuerpo responde a una infecci?n. La sepsis se trata en el hospital con antibi?ticos, l?quidos y otros medicamentos que se administran por v?a intravenosa. ?Siga estas instrucciones en su casa: ? ?Medicamentos ?Use los medicamentos de venta libre y los recetados solamente como se lo haya indicado el m?dico. ?Si le recetaron un antibi?tico, t?melo como se lo haya indicado el m?dico. No deje de usar el antibi?tico aunque comience a sentirse mejor. ?Instrucciones generales ?Aseg?rese de hacer lo siguiente: ?Vaciar la vejiga con frecuencia y en su totalidad. No contener la orina durante largos per?odos. ?Vaciar la vejiga despu?s de tener sexo. ?Limpiarse de atr?s  hacia adelante   despu?s de orinar o defecar, si es mujer. Usar cada trozo de papel higi?nico solo una vez cuando se limpie. ?Beber suficiente l?quido como para mantener la orina de color amarillo p?lido. ?Cumpla con todas las visitas de seguimiento. Esto es importante. ?Comun?quese con un m?dico si: ?Los s?ntomas no mejoran despu?s de 1 o 2 d?as de tratamiento. ?Los s?ntomas desaparecen y luego vuelven a aparecer. ?Solicite ayuda de inmediato si: ?Siente dolor intenso en la espalda o en la parte inferior del abdomen. ?Tiene fiebre o escalofr?os. ?Tiene n?useas o v?mitos. ?Resumen ?Una infecci?n urinaria (IU) es una infecci?n en cualquier parte de las v?as urinarias, que incluyen los ri?ones, los ur?teres, la vejiga y la uretra. ?La mayor?a de las infecciones de las v?as urinarias es causada por bacterias en la zona genital. ?El tratamiento de esta afecci?n suele incluir antibi?ticos. ?Si le recetaron un antibi?tico, t?melo como se lo haya indicado el m?dico. No deje de usar el antibi?tico aunque comience a sentirse mejor. ?Cumpla con todas las visitas de seguimiento. Esto es importante. ?Esta informaci?n no tiene como fin reemplazar el consejo del m?dico. Aseg?rese de hacerle al m?dico cualquier pregunta que tenga. ?Document Revised: 10/08/2020 Document Reviewed: 10/08/2020 ?Elsevier Patient Education ? 2023 Elsevier Inc. ? ?

## 2023-05-20 NOTE — Progress Notes (Signed)
Patient ID: [name], female   DOB: [birth year], 42 y.o.   MRN: [id number]     [name], is a 42 y.o. female  NFA:[id number]  ION:[id number]  DOB - [birth year]  Chief Complaint  Patient presents with   Urinary Frequency   Vaginal Discharge   burning during urination        Subjective:   [name] is a 42 y.o. female here today for 1 month h/o burning and frequency with urine. No fever.  No abdominal pain or pelvic pain. Monogamous with partner.    No problems updated.  ALLERGIES: No Known Allergies  PAST MEDICAL HISTORY: Past Medical History:  Diagnosis Date   AMENORRHEA 04/04/2009   Qualifier: Diagnosis of  By: Delrae Alfred MD, Lanora Manis     Anemia    Biliary colic    GERD (gastroesophageal reflux disease)    Symptomatic cholelithiasis     MEDICATIONS AT HOME: Prior to Admission medications   Medication Sig Start Date End Date Taking? Authorizing Provider  sulfamethoxazole-trimethoprim (BACTRIM DS) 800-160 MG tablet Take 1 tablet by mouth 2 (two) times daily. 05/20/23  Yes Anders Simmonds, PA-C  cetirizine (ZYRTEC) 10 MG tablet Take 1 tablet (10 mg total) by mouth daily. Patient not taking: Reported on 05/20/2023 09/03/22   Anders Simmonds, PA-C  omeprazole (PRILOSEC) 40 MG capsule Take 1 capsule (40 mg total) by mouth daily. Patient not taking: Reported on 05/20/2023 06/10/22   Napoleon Form, MD  predniSONE (DELTASONE) 10 MG tablet 6,5,4,3,2,1 take each days dose in am with food Patient not taking: Reported on 05/20/2023 09/03/22   Anders Simmonds, PA-C  triamcinolone cream (KENALOG) 0.1 % Apply 1 Application topically 2 (two) times daily. Patient not taking: Reported on 05/20/2023 09/03/22   Anders Simmonds, PA-C    ROS: Neg HEENT Neg resp Neg cardiac Neg GI Neg GU Neg MS Neg psych Neg neuro  Objective:   Vitals:   05/20/23 0848  BP: (!) 146/94  Pulse: (!) 102  SpO2: 95%  Weight: 199 lb 6.4 oz (90.4 kg)    Exam General appearance : Awake, alert, not in any distress. Speech Clear. Not toxic looking HEENT: Atraumatic and Normocephalic Chest: Good air entry bilaterally, CTAB.  No rales/rhonchi/wheezing CVS: S1 S2 regular, no murmurs.  Extremities: B/L Lower Ext shows no edema, both legs are warm to touch Neurology: Awake alert, and oriented X 3, CN II-XII intact, Non focal Skin: No Rash BP recheck 128/78  Data Review No results found for: "HGBA1C"  Assessment & Plan   1. Urinary tract infection symptoms Increase water intake.  Will cover.  Increase water intake - POCT URINALYSIS DIP (CLINITEK) - Cervicovaginal ancillary only - Urine Culture - sulfamethoxazole-trimethoprim (BACTRIM DS) 800-160 MG tablet; Take 1 tablet by mouth 2 (two) times daily.  Dispense: 10 tablet; Refill: 0  2. Elevated blood pressure reading 2nd BP was ok  3. Language barrier [employer]) interpreters used and additional time performing visit was required.     Return if symptoms worsen or fail to improve.  The patient was given clear instructions to go to ER or return to medical center if symptoms don't improve, worsen or new problems develop. The patient verbalized understanding. The patient was told to call to get lab results if they haven't heard anything in the next week.      Georgian Co, PA-C [facility] and [facility] [place], [place] [phone]   05/20/2023, 9:35 AM

## 2023-05-21 LAB — CERVICOVAGINAL ANCILLARY ONLY
Bacterial Vaginitis (gardnerella): POSITIVE — AB
Candida Glabrata: NEGATIVE
Candida Vaginitis: NEGATIVE
Chlamydia: NEGATIVE
Comment: NEGATIVE
Comment: NEGATIVE
Comment: NEGATIVE
Comment: NEGATIVE
Comment: NEGATIVE
Comment: NORMAL
Neisseria Gonorrhea: NEGATIVE
Trichomonas: NEGATIVE

## 2023-05-22 ENCOUNTER — Other Ambulatory Visit: Payer: Self-pay | Admitting: Physician Assistant

## 2023-05-22 ENCOUNTER — Other Ambulatory Visit: Payer: Self-pay

## 2023-05-22 LAB — URINE CULTURE

## 2023-05-22 MED ORDER — METRONIDAZOLE 500 MG PO TABS
500.0000 mg | ORAL_TABLET | Freq: Two times a day (BID) | ORAL | 0 refills | Status: DC
  Filled 2023-05-22: qty 14, 7d supply, fill #0

## 2023-05-22 MED ORDER — FLUCONAZOLE 150 MG PO TABS
150.0000 mg | ORAL_TABLET | Freq: Once | ORAL | 0 refills | Status: AC
  Filled 2023-05-22: qty 1, 1d supply, fill #0

## 2023-06-15 ENCOUNTER — Telehealth: Payer: Self-pay

## 2023-06-15 NOTE — Telephone Encounter (Signed)
Telephoned patient at mobile number using interpreter#[phone]. Left a voice message with BCCCP (scholarship) contact information.

## 2024-04-11 ENCOUNTER — Encounter: Payer: Self-pay | Admitting: Nurse Practitioner

## 2024-04-11 ENCOUNTER — Ambulatory Visit: Payer: Self-pay | Attending: Nurse Practitioner | Admitting: Nurse Practitioner

## 2024-04-11 VITALS — BP 134/86 | HR 80 | Resp 19 | Ht 62.0 in | Wt 203.0 lb

## 2024-04-11 DIAGNOSIS — N390 Urinary tract infection, site not specified: Secondary | ICD-10-CM

## 2024-04-11 DIAGNOSIS — R399 Unspecified symptoms and signs involving the genitourinary system: Secondary | ICD-10-CM

## 2024-04-11 DIAGNOSIS — Z1231 Encounter for screening mammogram for malignant neoplasm of breast: Secondary | ICD-10-CM

## 2024-04-11 NOTE — Progress Notes (Signed)
 Frequent urination, discomfort 3-4 months.  Has already seen someone about issue

## 2024-04-11 NOTE — Addendum Note (Signed)
 Addended by: Zita Ozimek on: 04/11/2024 03:07 PM   Modules accepted: Orders

## 2024-04-11 NOTE — Progress Notes (Signed)
 Assessment & Plan:  [name] was seen today for urinary tract infection.  Diagnoses and all orders for this visit:  UTI symptoms Make sure to void after sexual intercourse.   Breast cancer screening by mammogram -     MS 3D SCR MAMMO BILAT BR (aka MM); Future    Patient has been counseled on age-appropriate routine health concerns for screening and prevention. These are reviewed and up-to-date. Referrals have been placed accordingly. Immunizations are up-to-date or declined.    Subjective:   Chief Complaint  Patient presents with   Urinary Tract Infection    [name] 43 y.o. female presents to office today with complaints of re occuring UTI   VRI was used to communicate directly with patient for the entire encounter including providing detailed patient instructions.    She states she has been treated a few times for re occurring UTI at the health department. Wants to know why she keeps getting UTIs.  I have instructed her that without any culture results I am unable to indentify the type of bacteria she is being treated for at the health department. I did encourage her to make sure she urinates after sex if she is currently sexually active. She denies any symptoms of UTI or vaginitis today.     Review of Systems  Constitutional: Negative.  Negative for chills, fever, malaise/fatigue and weight loss.  Respiratory: Negative.  Negative for cough, shortness of breath and wheezing.   Cardiovascular: Negative.  Negative for chest pain, orthopnea and leg swelling.  Gastrointestinal:  Negative for abdominal pain.  Genitourinary: Negative.  Negative for flank pain.  Skin: Negative.  Negative for rash.  Psychiatric/Behavioral:  Negative for suicidal ideas.     Past Medical History:  Diagnosis Date   AMENORRHEA 04/04/2009   Qualifier: Diagnosis of  By: Delrae Alfred MD, Lanora Manis     Anemia    Biliary colic    GERD (gastroesophageal reflux disease)    Symptomatic  cholelithiasis     Past Surgical History:  Procedure Laterality Date   CHOLECYSTECTOMY N/A 09/22/2017   Procedure: LAPAROSCOPIC CHOLECYSTECTOMY;  Surgeon: Henrene Dodge, MD;  Location: [facility] ORS;  Service: General;  Laterality: N/A;   OVARIAN CYST REMOVAL      Family History  Problem Relation Age of Onset   Healthy Mother    Healthy Father    Cancer Neg Hx    Colon cancer Neg Hx    Stomach cancer Neg Hx    Pancreatic cancer Neg Hx     Social History Reviewed with no changes to be made today.   Outpatient Medications Prior to Visit  Medication Sig Dispense Refill   metroNIDAZOLE (FLAGYL) 500 MG tablet Take 1 tablet (500 mg total) by mouth 2 (two) times daily. (Patient not taking: Reported on 04/11/2024) 14 tablet 0   sulfamethoxazole-trimethoprim (BACTRIM DS) 800-160 MG tablet Take 1 tablet by mouth 2 (two) times daily. 10 tablet 0   cetirizine (ZYRTEC) 10 MG tablet Take 1 tablet (10 mg total) by mouth daily. (Patient not taking: Reported on 04/11/2024) 30 tablet 11   omeprazole (PRILOSEC) 40 MG capsule Take 1 capsule (40 mg total) by mouth daily. (Patient not taking: Reported on 04/11/2024) 90 capsule 3   predniSONE (DELTASONE) 10 MG tablet 6,5,4,3,2,1 take each days dose in am with food (Patient not taking: Reported on 04/11/2024) 21 tablet 0   triamcinolone cream (KENALOG) 0.1 % Apply 1 Application topically 2 (two) times daily. (Patient not taking: Reported on 04/11/2024) 45  g 0   No facility-administered medications prior to visit.    No Known Allergies     Objective:    BP 134/86 (BP Location: Left Arm, Patient Position: Sitting, Cuff Size: Normal)   Pulse 80   Resp 19   Ht 5\' 2"  (1.575 m)   Wt 203 lb (92.1 kg)   LMP 03/28/2024 (Approximate)   SpO2 100%   BMI 37.13 kg/m  Wt Readings from Last 3 Encounters:  04/11/24 203 lb (92.1 kg)  05/20/23 199 lb 6.4 oz (90.4 kg)  09/03/22 195 lb 6.4 oz (88.6 kg)    Physical Exam Constitutional:      Appearance: She is  well-developed.  HENT:     Head: Normocephalic and atraumatic.     Right Ear: Hearing, tympanic membrane, ear canal and external ear normal.     Left Ear: Hearing, tympanic membrane, ear canal and external ear normal.     Nose: Nose normal.     Right Turbinates: Not enlarged.     Left Turbinates: Not enlarged.     Mouth/Throat:     Lips: Pink.     Mouth: Mucous membranes are moist.     Dentition: No dental tenderness, gingival swelling, dental abscesses or gum lesions.     Pharynx: No oropharyngeal exudate.  Eyes:     General: No scleral icterus.       Right eye: No discharge.     Extraocular Movements: Extraocular movements intact.     Conjunctiva/sclera: Conjunctivae normal.     Pupils: Pupils are equal, round, and reactive to light.  Neck:     Thyroid: No thyromegaly.     Trachea: No tracheal deviation.  Cardiovascular:     Rate and Rhythm: Normal rate and regular rhythm.     Heart sounds: Normal heart sounds. No murmur heard.    No friction rub.  Pulmonary:     Effort: Pulmonary effort is normal. No accessory muscle usage or respiratory distress.     Breath sounds: Normal breath sounds. No decreased breath sounds, wheezing, rhonchi or rales.  Abdominal:     General: Bowel sounds are normal. There is no distension.     Palpations: Abdomen is soft. There is no mass.     Tenderness: There is no abdominal tenderness. There is no right CVA tenderness, left CVA tenderness, guarding or rebound.     Hernia: No hernia is present.  Musculoskeletal:        General: No tenderness or deformity. Normal range of motion.     Cervical back: Normal range of motion and neck supple.  Lymphadenopathy:     Cervical: No cervical adenopathy.  Skin:    General: Skin is warm and dry.     Findings: No erythema.  Neurological:     Mental Status: She is alert and oriented to person, place, and time.     Cranial Nerves: No cranial nerve deficit.     Motor: Motor function is intact.      Coordination: Coordination is intact. Coordination normal.     Gait: Gait is intact.     Deep Tendon Reflexes:     Reflex Scores:      Patellar reflexes are 1+ on the right side and 1+ on the left side. Psychiatric:        Attention and Perception: Attention normal.        Mood and Affect: Mood normal.        Speech: Speech normal.  Behavior: Behavior normal.        Thought Content: Thought content normal.        Judgment: Judgment normal.          Patient has been counseled extensively about nutrition and exercise as well as the importance of adherence with medications and regular follow-up. The patient was given clear instructions to go to ER or return to medical center if symptoms don't improve, worsen or new problems develop. The patient verbalized understanding.   Follow-up: No follow-ups on file.   Collins Dean, FNP-BC [facility] and [facility] [place], [place] [phone]   04/11/2024, 3:03 PM

## 2024-04-19 ENCOUNTER — Telehealth: Payer: Self-pay

## 2024-04-19 NOTE — Telephone Encounter (Signed)
 Telephoned patient at mobile number using interpreter, Mechele Spiegel. Mailed patient at mammogram scholarship application.

## 2024-06-23 ENCOUNTER — Ambulatory Visit
Admission: RE | Admit: 2024-06-23 | Discharge: 2024-06-23 | Disposition: A | Discharge status: Home / Self Care | Payer: Self-pay | Source: Ambulatory Visit | Attending: Nurse Practitioner | Admitting: Nurse Practitioner

## 2024-06-23 DIAGNOSIS — Z1231 Encounter for screening mammogram for malignant neoplasm of breast: Secondary | ICD-10-CM

## 2024-06-28 ENCOUNTER — Ambulatory Visit: Payer: Self-pay | Admitting: Nurse Practitioner

## 2024-06-28 DIAGNOSIS — R928 Other abnormal and inconclusive findings on diagnostic imaging of breast: Secondary | ICD-10-CM

## 2024-07-11 ENCOUNTER — Ambulatory Visit: Payer: Self-pay | Admitting: Nurse Practitioner

## 2024-07-14 ENCOUNTER — Ambulatory Visit: Payer: Self-pay | Admitting: *Deleted

## 2024-07-14 VITALS — BP 128/95 | Wt 202.0 lb

## 2024-07-14 DIAGNOSIS — Z1239 Encounter for other screening for malignant neoplasm of breast: Secondary | ICD-10-CM

## 2024-07-14 NOTE — Patient Instructions (Signed)
 Explained breast self awareness with Kathlee Vega-Saucedo. Patient did not need a Pap smear today due to last Pap smear and HPV typing was in 2024 per patient. Let her know BCCCP will cover Pap smears and HPV typing every 5 years unless has a history of abnormal Pap smears. Referred patient to the [facility] for a diagnostic mammogram per recommendation. Appointment scheduled Friday, July 15, 2024 at 1350. Patient aware of appointment and will be there. Braeden Vega-Saucedo verbalized understanding.  Neelie Welshans, Wanda Ship, RN 9:46 AM

## 2024-07-14 NOTE — Progress Notes (Signed)
 [name] is a 43 y.o. female who presents to [facility] clinic today with no complaints. Patient had a screening mammogram completed 06/23/2024 that additional imaging of the right breast is recommended for follow up.   Pap Smear: Pap smear not completed today. Last Pap smear was in 2024 at the [facility] clinic and was normal with negative HPV per patient. Per patient has no history of an abnormal Pap smear. Last Pap smear result is not available in Epic.   Physical exam: Breasts Breasts symmetrical. No skin abnormalities bilateral breasts. No nipple retraction bilateral breasts. No nipple discharge bilateral breasts. No lymphadenopathy. No lumps palpated bilateral breasts. No complaints of pain or tenderness on exam.      MS 3D SCR MAMMO BILAT BR (aka MM) Result Date: 06/28/2024 CLINICAL DATA:  Screening. Baseline examination. EXAM: DIGITAL SCREENING BILATERAL MAMMOGRAM WITH TOMOSYNTHESIS AND CAD TECHNIQUE: Bilateral screening digital craniocaudal and mediolateral oblique mammograms were obtained. Bilateral screening digital breast tomosynthesis was performed. The images were evaluated with computer-aided detection. COMPARISON:  None available. ACR Breast Density Category b: There are scattered areas of fibroglandular density. FINDINGS: In the right breast, a possible asymmetry warrants further evaluation. In the left breast, no findings suspicious for malignancy. IMPRESSION: Further evaluation is suggested for possible asymmetry in the right breast. RECOMMENDATION: Diagnostic mammogram and possibly ultrasound of the right breast. (Code:[id number]-57M) The patient will be contacted regarding the findings, and additional imaging will be scheduled. BI-RADS CATEGORY  0: Incomplete: Need additional imaging evaluation. Electronically Signed   By: [name] M.D.   On: 06/28/2024 11:20    Pelvic/Bimanual Pap is not indicated today per BCCCP guidelines.    Smoking  History: Patient has never smoked.   Patient Navigation: Patient education provided. Access to services provided for patient through [facility] program. Spanish interpreter [name] from [facility] provided.   Breast and Cervical Cancer Risk Assessment: Patient does not have family history of breast cancer, known genetic mutations, or radiation treatment to the chest before age 96. Patient does not have history of cervical dysplasia, immunocompromised, or DES exposure in-utero.  Risk Scores as of Encounter on 07/14/2024     [name]           5-year 0.2%   Lifetime 3.55%            Last calculated by [name], [name], CMA on 07/14/2024 at  9:24 AM        A: BCCCP exam without pap smear No complaints.  P: Referred patient to the [facility] for a diagnostic mammogram per recommendation. Appointment scheduled Friday, July 15, 2024 at 1350.  Kaylee Wanda SQUIBB, RN 07/14/2024 9:46 AM

## 2024-07-15 ENCOUNTER — Ambulatory Visit
Admission: RE | Admit: 2024-07-15 | Discharge: 2024-07-15 | Disposition: A | Discharge status: Home / Self Care | Source: Ambulatory Visit | Attending: Nurse Practitioner | Admitting: Nurse Practitioner

## 2024-07-15 ENCOUNTER — Other Ambulatory Visit: Payer: Self-pay | Admitting: Nurse Practitioner

## 2024-07-15 DIAGNOSIS — R928 Other abnormal and inconclusive findings on diagnostic imaging of breast: Secondary | ICD-10-CM

## 2024-07-15 DIAGNOSIS — N6310 Unspecified lump in the right breast, unspecified quadrant: Secondary | ICD-10-CM

## 2024-07-18 ENCOUNTER — Ambulatory Visit: Payer: Self-pay | Admitting: Nurse Practitioner

## 2024-08-16 ENCOUNTER — Other Ambulatory Visit: Payer: Self-pay

## 2025-01-20 ENCOUNTER — Other Ambulatory Visit: Payer: Self-pay | Admitting: Nurse Practitioner

## 2025-01-20 ENCOUNTER — Ambulatory Visit: Admission: RE | Admit: 2025-01-20 | Source: Ambulatory Visit

## 2025-01-20 DIAGNOSIS — R928 Other abnormal and inconclusive findings on diagnostic imaging of breast: Secondary | ICD-10-CM

## 2025-01-20 DIAGNOSIS — N6310 Unspecified lump in the right breast, unspecified quadrant: Secondary | ICD-10-CM

## 2025-01-22 ENCOUNTER — Ambulatory Visit: Payer: Self-pay | Admitting: Nurse Practitioner
